# Patient Record
Sex: Male | Born: 1985 | Race: White | Hispanic: No | Marital: Single | Smoking: Never smoker
Health system: Southern US, Community
[De-identification: ages and names within clinical notes are randomized; demographics above are authoritative.]

## PROBLEM LIST (undated history)

## (undated) DIAGNOSIS — F988 Other specified behavioral and emotional disorders with onset usually occurring in childhood and adolescence: Secondary | ICD-10-CM

## (undated) DIAGNOSIS — M79673 Pain in unspecified foot: Secondary | ICD-10-CM

## (undated) DIAGNOSIS — F332 Major depressive disorder, recurrent severe without psychotic features: Secondary | ICD-10-CM

---

## 2015-02-03 ENCOUNTER — Ambulatory Visit
Admit: 2015-02-03 | Discharge: 2015-02-03 | Payer: PRIVATE HEALTH INSURANCE | Attending: Medical | Primary: Nurse Practitioner

## 2015-02-03 ENCOUNTER — Inpatient Hospital Stay: Admit: 2015-02-03 | Payer: Self-pay | Primary: Nurse Practitioner

## 2015-02-03 DIAGNOSIS — Z7689 Persons encountering health services in other specified circumstances: Secondary | ICD-10-CM

## 2015-02-03 DIAGNOSIS — Z139 Encounter for screening, unspecified: Secondary | ICD-10-CM

## 2015-02-03 LAB — METABOLIC PANEL, COMPREHENSIVE
A-G Ratio: 1.2 (ref 0.8–1.7)
ALT (SGPT): 20 U/L (ref 16–61)
AST (SGOT): 18 U/L (ref 15–37)
Albumin: 4.2 g/dL (ref 3.4–5.0)
Alk. phosphatase: 70 U/L (ref 45–117)
Anion gap: 4 mmol/L (ref 3.0–18)
BUN/Creatinine ratio: 15 (ref 12–20)
BUN: 15 MG/DL (ref 7.0–18)
Bilirubin, total: 0.4 MG/DL (ref 0.2–1.0)
CO2: 29 mmol/L (ref 21–32)
Calcium: 9.5 MG/DL (ref 8.5–10.1)
Chloride: 110 mmol/L — ABNORMAL HIGH (ref 100–108)
Creatinine: 1.02 MG/DL (ref 0.6–1.3)
GFR est AA: >60 mL/min/{1.73_m2} (ref >60)
GFR est non-AA: >60 mL/min/{1.73_m2} (ref >60)
Globulin: 3.4 g/dL (ref 2.0–4.0)
Glucose: 98 mg/dL (ref 74–99)
Potassium: 4.8 mmol/L (ref 3.5–5.5)
Protein, total: 7.6 g/dL (ref 6.4–8.2)
Sodium: 143 mmol/L (ref 136–145)

## 2015-02-03 MED ORDER — BUPROPION XL 150 MG 24 HR TAB
150 mg | ORAL_TABLET | ORAL | Status: DC
Start: 2015-02-03 — End: 2015-04-06

## 2015-02-03 MED ORDER — AMPHETAMINE-DEXTROAMPHETAMINE 5 MG TAB
5 mg | ORAL_TABLET | Freq: Every day | ORAL | Status: DC
Start: 2015-02-03 — End: 2015-03-02

## 2015-02-03 NOTE — Progress Notes (Signed)
Quick Note:        Please advise blood work looks good    ______

## 2015-02-03 NOTE — Progress Notes (Signed)
HISTORY OF PRESENT ILLNESS  Bobby DykesJoshua Reid is a 29 y.o. male.  HPI Comments: 29 yo male presenting to establish care and also to discuss depression and ADD.  Patient states that he has been struggling with depression for years but has never sought treatment. He states that he becomes tearful very easily, lacks interest in activities he use to enjoy and feels sad all the time.  He states its affecting his relationships.  He also states that he has difficulty concentrating and completing tasks. He states that he will start a task but it is difficult to pay attention long enough to finish it.  He recently lost his job and has lost several other jobs secondary to his inability to concentrate and frequent tardiness.        History reviewed. No pertinent past medical history.    No current outpatient prescriptions on file prior to visit.     No current facility-administered medications on file prior to visit.       Allergies   Allergen Reactions   ??? Penicillins Hives       Review of Systems   Constitutional: Positive for malaise/fatigue. Negative for fever and chills.   Eyes: Negative for blurred vision and double vision.   Respiratory: Negative for cough and shortness of breath.    Cardiovascular: Negative for chest pain and palpitations.   Gastrointestinal: Negative for heartburn, nausea, vomiting, abdominal pain, diarrhea, constipation, blood in stool and melena.   Musculoskeletal: Negative for myalgias.   Neurological: Positive for tingling (in hands in morning). Negative for headaches.   Psychiatric/Behavioral: Positive for depression. Negative for suicidal ideas. The patient does not have insomnia.           BP 110/72 mmHg   Pulse 82   Temp(Src) 97 ??F (36.1 ??C) (Oral)   Resp 16   Ht 5\' 11"  (1.803 m)   Wt 145 lb (65.772 kg)   BMI 20.23 kg/m2   SpO2 98%    Physical Exam   Constitutional: He is oriented to person, place, and time. He appears well-developed and well-nourished. No distress.   Tearful during history    HENT:   Right Ear: External ear normal.   Left Ear: External ear normal.   Nose: Nose normal.   Mouth/Throat: Oropharynx is clear and moist. No oropharyngeal exudate.   Eyes: Conjunctivae and EOM are normal. Pupils are equal, round, and reactive to light.   Neck: No thyromegaly present.   Cardiovascular: Normal rate and regular rhythm.    Pulmonary/Chest: Effort normal and breath sounds normal.   Abdominal: Soft. Bowel sounds are normal. He exhibits no distension and no mass. There is no tenderness. There is no rebound and no guarding.   Musculoskeletal: He exhibits no edema or tenderness.   Lymphadenopathy:     He has no cervical adenopathy.   Neurological: He is alert and oriented to person, place, and time.   Skin: Skin is warm and dry. He is not diaphoretic.   Psychiatric: His speech is delayed. He is slowed and withdrawn. He exhibits a depressed mood. He expresses no suicidal ideation.   S- over sleeps (+)  I- irritable (+)  G- guilt (+)  E- low energy (+)  C- concentration affected (+)  A- appetite disturbance (+)  P- psychomotor slowing (+)  S- denies suicidal ideations (-)       ASSESSMENT and PLAN    ICD-10-CM ICD-9-CM    1. Establishing care with new doctor, encounter for Z71.89 V65.8  CANCELED: CBC WITH AUTOMATED DIFF      CANCELED: HEMOGLOBIN A1C      CANCELED: T4, FREE      CANCELED: TSH, 3RD GENERATION      CANCELED: VITAMIN D, 25 HYDROXY   2. Severe episode of recurrent major depressive disorder, without psychotic features (HCC) F33.2 296.33 buPROPion XL (WELLBUTRIN XL) 150 mg tablet   3. ADD (attention deficit disorder) F90.0 314.00 dextroamphetamine-amphetamine (ADDERALL) 5 mg tablet   4. Tobacco abuse Z72.0 305.1 buPROPion XL (WELLBUTRIN XL) 150 mg tablet   5. Screening Z13.9 V82.9 METABOLIC PANEL, COMPREHENSIVE      CANCELED: CBC WITH AUTOMATED DIFF      CANCELED: LIPID PANEL      CANCELED: HEMOGLOBIN A1C      CANCELED: T4, FREE      CANCELED: TSH, 3RD GENERATION       CANCELED: VITAMIN D, 25 HYDROXY        Medical Decision Making:  Establish Care- labs- Advised I would follow up with patient regarding results.      Depression- Wellbutrin 150 mg XR + provided phone numbers for [place] [facility] and crisis hotlines with strong encouragement to call and schedule at [facility]- advised that exercise also helps elevate mood    ADD- Adderal 5 mg once daily- PMP pulled, nothing on report.  Advised of potential for abuse.      Follow-up Disposition:  Return in about 4 weeks (around 03/03/2015) for for follow up meds/depression.     Patient acknowledges understanding of instructions and acknowledges understanding to call back if current symptoms worsen or new symptoms arise.  Patient acknowledges and agrees with plan.    June Leap, PA

## 2015-02-03 NOTE — Progress Notes (Signed)
Bobby Reid is a 29 y.o. Bobby Reid presents today for depression. Pt is in Room # 8    1. Have you been to the ER, urgent care clinic since?  Hospitalized?No    2. Have you seen or consulted any other health care providers outside of the [facility] ?  Include any pap smears or colon screening. No

## 2015-02-03 NOTE — Patient Instructions (Addendum)
[phone] - emergency phone number for depression    [phone]  National Suicide Prevention Lifeline    [phone]- [place] [facility] intake phone number, call to schedule therapy appointment    [phone]- [place] health care exchange for low cost health insurance    Goodrx.com- research medications here to find cheapest cost at local pharmacies        Depression and Chronic Disease: Care Instructions  Your Care Instructions  A chronic disease is one that you have for a long time. Some chronic diseases can be controlled, but they usually cannot be cured. Depression is common in people with chronic diseases, but it often goes unnoticed.  Many people have concerns about seeking treatment for a mental health problem. You may think it's a sign of weakness, or you don't want people to know about it. It's important to overcome these reasons for not seeking treatment. Treating depression or anxiety is good for your health.  Follow-up care is a key part of your treatment and safety. Be sure to make and go to all appointments, and call your doctor if you are having problems. It's also a good idea to know your test results and keep a list of the medicines you take.  How can you care for yourself at home?  Watch for symptoms of depression  The symptoms of depression are often subtle at first. You may think they are caused by your disease rather than depression. Or you may think it is normal to be depressed when you have a chronic disease.  If you are depressed you may:  ?? Feel sad or hopeless.  ?? Feel guilty or worthless.  ?? Not enjoy the things you used to enjoy.  ?? Feel hopeless, as though life is not worth living.  ?? Have trouble thinking or remembering.  ?? Have low energy, and you may not eat or sleep well.  ?? Pull away from others.  ?? Think often about death or killing yourself. (Keep the numbers for these national suicide hotlines: 1-800-273-TALK [1-6502301995] and  1-800-SUICIDE [1-435-313-1395].)  Get treatment  By treating your depression, you can feel more hopeful and have more energy. If you feel better, you may take better care of yourself, so your health may improve.  ?? Talk to your doctor if you have any changes in mood during treatment for your disease.  ?? Ask your doctor for help. Counseling, antidepressant medicine, or a combination of the two can help most people with depression. Often a combination works best. Counseling can also help you cope with having a chronic disease.  When should you call for help?  Call 911 anytime you think you may need emergency care. For example, call if:  ?? You feel like hurting yourself or someone else.  ?? Someone you know has depression and is about to attempt or is attempting suicide.  Call your doctor now or seek immediate medical care if:  ?? You hear voices.  ?? Someone you know has depression and:  ?? Starts to give away his or her possessions.  ?? Uses illegal drugs or drinks alcohol heavily.  ?? Talks or writes about death, including writing suicide notes or talking about guns, knives, or pills.  ?? Starts to spend a lot of time alone.  ?? Acts very aggressively or suddenly appears calm.  Watch closely for changes in your health, and be sure to contact your doctor if:  ?? You do not get better as expected.  Where can you learn more?   Go to [email or web address][email or web address]://www.healthwise.net/[facility]  Enter A548 in the search box to learn more about "Depression and Chronic Disease: Care Instructions."   ?? 2006-2015 Healthwise[employer] Inc[employer]. Care instructions adapted under license by [employer] (which disclaims liability or warranty for this information). This care instruction is for use with your licensed healthcare professional. If you have questions about a medical condition or this instruction, always ask your healthcare professional. Healthwise, Incorporated disclaims any warranty or liability for your use of this information.   Content Version: 10.7.482551; Current as of: March 05, 2014

## 2015-02-04 NOTE — Telephone Encounter (Signed)
Attempted to contact patient Bobby DykesJoshua Reid regarding lab finding.  Call back number left and will call again.

## 2015-03-02 ENCOUNTER — Ambulatory Visit: Admit: 2015-03-02 | Discharge: 2015-03-02 | Attending: Medical | Primary: Nurse Practitioner

## 2015-03-02 DIAGNOSIS — F332 Major depressive disorder, recurrent severe without psychotic features: Secondary | ICD-10-CM

## 2015-03-02 MED ORDER — AMPHETAMINE-DEXTROAMPHETAMINE 10 MG TAB
10 mg | ORAL_TABLET | ORAL | Status: DC
Start: 2015-03-02 — End: 2015-03-26
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
## 2015-03-02 NOTE — Progress Notes (Signed)
[name] is a 29 y.o. male presents today for follow-up.     1. Have you been to the ER, urgent care clinic since your last visit?  Hospitalized since your last visit?No    2. Have you seen or consulted any other health care providers outside of the [facility] since your last visit?  Include any pap smears or colon screening. No

## 2015-03-02 NOTE — Patient Instructions (Signed)
Amphetamine/Dextroamphetamine (By mouth)   Treats ADHD. Also treats narcolepsy. This medicine is a stimulant.  Brand Name(s):Adderall, Adderall XR   There may be other brand names for this medicine.  When This Medicine Should Not Be Used:   This medicine is not right for everyone. Do not use it if you had an allergic reaction to amphetamine, dextroamphetamine, or similar medicines, or you have glaucoma, a history of drug abuse, heart or blood vessel disease (such as arteriosclerosis), or an overactive thyroid.   How to Use This Medicine:   Long Acting Capsule, Tablet  ?? Take your medicine as directed. Your dose may need to be changed several times to find what works best for you.  ?? Extended-release capsule:   ?? Take the capsule in the morning. You may have trouble falling asleep at night if you take it in the afternoon or evening.  ?? Swallow the capsule whole. Do not crush, break, or chew it.  ?? You may take the capsule with or without food.  ?? If you cannot swallow the capsule, carefully open it and sprinkle the beads over a spoonful of applesauce. Swallow the mixture right away without chewing. Do not store it. Do not crush or chew the beads.  ?? Tablet: Take it in the morning and early afternoon. You may have trouble falling asleep if you take it at night.  ?? This medicine should come with a Medication Guide. Ask your pharmacist for a copy if you do not have one.  ?? Missed dose: Take a dose as soon as you remember. If it is almost time for your next dose, wait until then and take a regular dose. Do not take extra medicine to make up for a missed dose.  ?? Store the medicine in a closed container at room temperature, away from heat, moisture, and direct light.  Drugs and Foods to Avoid:   Ask your doctor or pharmacist before using any other medicine, including over-the-counter medicines, vitamins, and herbal products.  ?? Do not use this medicine if you are taking an MAO inhibitor (MAOI) or  you took an MAOI within the past 14 days.  ?? Some medicines and foods can affect how this medicine works. Tell your doctor if you are taking any of the following:   ?? Blood pressure medicine  ?? Chlorpromazine  ?? Cold or allergy medicine that contains a decongestant  ?? Lithium  ?? Methenamine  ?? Seizure medicine  ?? Fruit juice, vitamin C, and some medicines can change how this medicine dissolves in your stomach. Tell your doctor if you use an antacid or a stomach medicine (such as lansoprazole, omeprazole, pantoprazole).  Warnings While Using This Medicine:   ?? Tell your doctor if you are pregnant or breastfeeding, or if you have heart rhythm problems, high blood pressure, circulation problems, or a history of heart attack or stroke. Tell your doctor if you or anyone in your family has a history of depression, bipolar disorder, suicide, mental illness, or drug or alcohol dependence. Tell your doctor if you have Tourette syndrome or a history of seizures.  ?? This medicine can be habit-forming. Do not use more than your prescribed dose. Call your doctor if you think your medicine is not working.  ?? This medicine may cause the following problems:   ?? Changes in behavior, or unusual thoughts  ?? Raynaud phenomenon (a problem with blood circulation in your fingers or toes)  ?? Serious heart or blood vessel problems  ??   Slowed growth (in children)  ?? This medicine may make you dizzy or cause blurred vision. Do not drive or do anything that could be dangerous until you know how this medicine affects you.  ?? Tell any doctor or dentist who treats you that you are using this medicine. This medicine may affect certain medical test results.  ?? Keep all medicine out of the reach of children. Never share your medicine with anyone.  Possible Side Effects While Using This Medicine:   Call your doctor right away if you notice any of these side effects:  ?? Allergic reaction: Itching or hives, swelling in your face or hands,  swelling or tingling in your mouth or throat, chest tightness, trouble breathing  ?? Blurred vision or changes in vision  ?? Chest pain, trouble breathing, or fainting  ?? Extreme energy or restlessness, mood or mental changes, confusion, agitation, unusual behavior  ?? Fever, shaking, or uncontrolled muscle movements  ?? Fast, pounding, or uneven heartbeat  ?? Seeing, hearing, or feeling things that are not there  ?? Seizure  If you notice these less serious side effects, talk with your doctor:   ?? Dry mouth, diarrhea, nausea, vomiting, stomach pain  ?? Headache or dizziness  ?? Loss of appetite, weight loss  ?? Trouble sleeping  If you notice other side effects that you think are caused by this medicine, tell your doctor.   Call your doctor for medical advice about side effects. You may report side effects to FDA at 1-800-FDA-1088  ?? 2014 [employer] Information is for End User's use only and may not be sold, redistributed or otherwise used for commercial purposes.  The above information is an educational aid only. It is not intended as medical advice for individual conditions or treatments. Talk to your doctor, nurse or pharmacist before following any medical regimen to see if it is safe and effective for you.

## 2015-03-16 NOTE — Telephone Encounter (Signed)
Spoke with Bobby Reid, Verified 2 patient identifiers. Spoke with patient in regards to lab results. Relayed PA's notes.  Patient acknowledges understanding and voices no further questions or concerns at this time.

## 2015-03-25 ENCOUNTER — Ambulatory Visit: Admit: 2015-03-25 | Discharge: 2015-03-25 | Attending: Family Medicine | Primary: Nurse Practitioner

## 2015-03-25 DIAGNOSIS — S99922A Unspecified injury of left foot, initial encounter: Secondary | ICD-10-CM

## 2015-03-25 MED ORDER — NAPROXEN 500 MG TAB
500 mg | ORAL_TABLET | Freq: Two times a day (BID) | ORAL | Status: DC
Start: 2015-03-25 — End: 2015-03-25

## 2015-03-25 MED ORDER — NAPROXEN 500 MG TAB
500 mg | ORAL_TABLET | Freq: Two times a day (BID) | ORAL | Status: DC
Start: 2015-03-25 — End: 2015-04-06

## 2015-03-25 NOTE — Progress Notes (Signed)
SUBJECTIVE:  Bobby Reid is a 29 y.o. year old male   Chief Complaint   Patient presents with   ??? Foot Injury       History of Present Illness:   The patient walked in after hour.    The patient is having moderate pain in the left heel for the last 2 weeks. The symptom started after he jumped off 5 feet and his heel landed on gravel. It seemed to get better with rest for few days; but the symptom got worse with swelling from standing on his feet all day at work.  The symptom is continuous with worsening from load bearing.  The symptom is not associated with weakness    No systemic, respiratory or cardiovascular symptoms.    Past Medical History   Diagnosis Date   ??? Depression    ??? ADD (attention deficit disorder)      History reviewed. No pertinent past surgical history.     Current Outpatient Prescriptions   Medication Sig   ??? dextroamphetamine-amphetamine (ADDERALL) 10 mg tablet Take half tablet by mouth twice daily   ??? buPROPion XL (WELLBUTRIN XL) 150 mg tablet Take 1 Tab by mouth every morning.     No current facility-administered medications for this visit.       Allergies   Allergen Reactions   ??? Penicillins Hives          Review of Systems:   ROS: Constitutional: No fever, chills, night sweats, malaise, dizziness.  Cardiovascular: No angina, palpitations, PND, orthopnea, lightheadedness, edema, claudication.  Respiratory: No dyspnea, wheeze, pleurisy, hemoptysis, unusual cough or sputum.  Gastrointestinal: No nausea/ vomiting, bowel habit change, pain, GER symptoms, melena, hematochezia, anorexia.  Neurological: No seizures, numbness, dizziness, speech abnormality, incontinence.  Musculoskeletal: No other joint swelling/pain, instability, focal weakness, stiffness/rigidity, radicular pain.  Psychiatric: No agitation, confusion/disorientation, suicidal or homicidal ideation.    OBJECTIVE:  Physical Exam:   Constitutional: General Appearance:  well developed, well nourished,  nontoxic, in no acute distress.   Visit Vitals   Item Reading   ??? BP 120/76 mmHg   ??? Pulse 94   ??? Temp(Src) 98.8 ??F (37.1 ??C) (Oral)   ??? Resp 16   ??? Ht 5\' 11"  (1.803 m)   ??? Wt 148 lb (67.132 kg)   ??? BMI 20.65 kg/m2   ??? SpO2 98%     Pulmonary: Respiratory effort: normal; no dyspnea, no retractions, no accessory muscle use.  GI:: Normal bowel sounds.  No masses; no tenderness; no rebound/rigidity; no CVA tenderness.  No hepatosplenomegaly.  Psychiatric: Oriented to time, place and person.  Musculoskeletal: NC/AT.  Neck-supple. Lt foot:- No heat, effusion, redness mild-moderate swelling @ the heel with moderate tenderness.  Good ROM with pain in heel.  No instability.  Strength OK.  Lt ankle: No heat, effusion, redness, tenderness.  Normal ROM.  No instability.  Normal strength & tone.        ASSESSMENT:     1. Injury of heel, left, initial encounter    2. Left foot pain        PLAN:     Pharmacologic Management: Medications reviewed with the patient.  Napoxen 500 mg bid with meals.     Orders Placed This Encounter   ??? XR CALCANEUS LT   ??? REFERRAL TO ORTHOPEDICS   ??? naproxen (NAPROSYN) 500 mg tablet      Other Instructions: Discussed DDx, follow-up & work-up.  Discussed risk/benefit & side effect of treatment.  Follow up  in 2 weeks, prn sooner.  Rest, warm compress and posture care to avoid pressure on the heel until seen by orthopedist.  Health risk from non adherence discussed.  Patient voiced understanding.     Joneen BoersSAGHANA Alanii Ramer, MD

## 2015-03-25 NOTE — Progress Notes (Signed)
Bobby DykesJoshua Reid is a 29 y.o. male presents today for same day sick visit for left heel pain. Pt is in Room # 5      Learning Assessment (baseline): Completed  Depression Screening: Completed      1. Have you been to the ER, urgent care clinic since your last visit? Hospitalized since your last visit?No    2. Have you seen or consulted any other health care providers outside of the [facility] since your last visit? Include any pap smears or colon screening. No

## 2015-03-26 ENCOUNTER — Encounter

## 2015-03-26 MED ORDER — AMPHETAMINE-DEXTROAMPHETAMINE 10 MG TAB
10 mg | ORAL_TABLET | ORAL | Status: DC
Start: 2015-03-26 — End: 2015-04-06

## 2015-03-26 NOTE — Telephone Encounter (Signed)
Attempted to contact patient Bobby DykesJoshua Fairbank to inform patient that prescription for Adderal is ready for pick up in from office. Phone number on file not in service please have patient give updated phone number.I myself or one of the other nurses will attempt to contact again.

## 2015-03-29 NOTE — Progress Notes (Signed)
Patient picked up prescription. Photo ID verified.

## 2015-03-30 ENCOUNTER — Emergency Department: Admit: 2015-03-31 | Payer: Self-pay | Primary: Nurse Practitioner

## 2015-03-30 ENCOUNTER — Encounter

## 2015-03-30 ENCOUNTER — Encounter: Attending: Medical | Primary: Nurse Practitioner

## 2015-03-30 DIAGNOSIS — S0990XA Unspecified injury of head, initial encounter: Secondary | ICD-10-CM

## 2015-03-30 NOTE — Telephone Encounter (Signed)
Called pt and unable to leave message. The call was to inform pt xrays needs to be complete today if not seen by orthopedic.

## 2015-03-30 NOTE — ED Notes (Signed)
Pt verbalizes that he feels better.  Pt discharged home-calling for a ride now.
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
## 2015-03-30 NOTE — ED Notes (Signed)
Pt yelling and screaming at some one on the phone.  Pt gets up and slams his room door and begins to yell even louder at people on the phone. Pt was told that he needed to lower his voice and that he was up for discharge and if he felt it was necessary he could resume his phone call outside.

## 2015-03-31 ENCOUNTER — Inpatient Hospital Stay
Admit: 2015-03-31 | Discharge: 2015-03-31 | Disposition: A | Discharge status: Home / Self Care | Payer: Self-pay | Attending: Emergency Medicine

## 2015-03-31 ENCOUNTER — Ambulatory Visit: Payer: Self-pay | Primary: Nurse Practitioner

## 2015-03-31 MED ORDER — HYDROMORPHONE 2 MG/ML INJECTION SOLUTION
2 mg/mL | INTRAMUSCULAR | Status: AC
Start: 2015-03-31 — End: 2015-03-30
  Administered 2015-03-31: 02:00:00 via INTRAVENOUS

## 2015-03-31 MED ORDER — DIPHTH,PERTUS(AC)TETANUS VAC(PF) 2.5 LF UNIT-8 MCG-5 LF/0.5 ML INJ
INTRAMUSCULAR | Status: AC
Start: 2015-03-31 — End: 2015-03-30
  Administered 2015-03-31: 02:00:00 via INTRAMUSCULAR

## 2015-03-31 MED ORDER — IBUPROFEN 800 MG TAB
800 mg | ORAL_TABLET | Freq: Four times a day (QID) | ORAL | Status: AC | PRN
Start: 2015-03-31 — End: 2015-04-06

## 2015-03-31 MED ORDER — HYDROCODONE-ACETAMINOPHEN 5 MG-325 MG TAB
5-325 mg | ORAL_TABLET | ORAL | Status: DC
Start: 2015-03-31 — End: 2015-04-06

## 2015-03-31 MED ORDER — HYDROMORPHONE 2 MG/ML INJECTION SOLUTION
2 mg/mL | INTRAMUSCULAR | Status: AC
Start: 2015-03-31 — End: 2015-03-30

## 2015-03-31 MED ORDER — CYCLOBENZAPRINE 10 MG TAB
10 mg | ORAL_TABLET | Freq: Three times a day (TID) | ORAL | Status: AC
Start: 2015-03-31 — End: 2015-04-02

## 2015-03-31 MED FILL — BOOSTRIX TDAP 2.5 LF UNIT-8 MCG-5 LF/0.5 ML INTRAMUSCULAR SYRINGE: INTRAMUSCULAR | Qty: 1

## 2015-03-31 MED FILL — HYDROMORPHONE 2 MG/ML INJECTION SOLUTION: 2 mg/mL | INTRAMUSCULAR | Qty: 1

## 2015-04-06 ENCOUNTER — Ambulatory Visit: Admit: 2015-04-06 | Discharge: 2015-04-06 | Attending: Medical | Primary: Nurse Practitioner

## 2015-04-06 ENCOUNTER — Telehealth

## 2015-04-06 DIAGNOSIS — F419 Anxiety disorder, unspecified: Secondary | ICD-10-CM

## 2015-04-06 MED ORDER — BUPROPION XL 300 MG 24 HR TAB
300 mg | ORAL_TABLET | ORAL | Status: DC
Start: 2015-04-06 — End: 2015-10-13

## 2015-04-06 MED ORDER — AMPHETAMINE-DEXTROAMPHETAMINE 10 MG TAB
10 mg | ORAL_TABLET | Freq: Two times a day (BID) | ORAL | Status: DC
Start: 2015-04-06 — End: 2015-04-06

## 2015-04-06 MED ORDER — AMPHETAMINE-DEXTROAMPHETAMINE 20 MG TAB
20 mg | ORAL_TABLET | ORAL | Status: DC
Start: 2015-04-06 — End: 2015-04-27

## 2015-04-06 NOTE — Telephone Encounter (Signed)
Patient had requested a larger dose to split as 10 mg BID was too expensive
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
## 2015-04-06 NOTE — Patient Instructions (Signed)
Attention Deficit Hyperactivity Disorder (ADHD) in Adults: Care Instructions  Your Care Instructions  Attention deficit hyperactivity disorder, or ADHD, is a condition that makes it hard to pay attention. So you may have problems when you try to focus, get organized, and finish tasks. It might make you more active than other people. Or you might do things without thinking first.  ADHD is very common. It usually starts in early childhood. Many adults don't realize they have it until their children are diagnosed. Then they become aware of their own symptoms.  Doctors don't know what causes ADHD. But it often runs in families.  ADHD can be treated with medicines, behavior training, and counseling. Treatment can improve your life.  Follow-up care is a key part of your treatment and safety. Be sure to make and go to all appointments, and call your doctor if you are having problems. It's also a good idea to know your test results and keep a list of the medicines you take.  How can you care for yourself at home?  ?? Learn all you can about ADHD. This will help you and your family understand it better.  ?? Take your medicines exactly as prescribed. Call your doctor if you think you are having a problem with your medicine. You will get more details on the specific medicines your doctor prescribes.  ?? If you miss a dose of your medicine, do not take an extra dose.  ?? If your doctor suggests counseling, find a counselor you like and trust. Talk openly and honestly. Be willing to make some changes.  ?? Find a support group for adults with ADHD. Talking to others with the same problems can help you feel better. It can also give you ideas about how to best cope with the condition.  ?? Get rid of distractions at your work space. Keep your desk clean. Try not to face a window or busy hallway.  ?? Use files, planners, and other tools to keep you organized.  ?? Limit use of alcohol, and do not use illegal drugs. People with ADHD  tend to become addicted more easily than others. Tell your doctor if you need help to quit. Counseling, support groups, and sometimes medicines can help you stay free of alcohol or drugs.  ?? Get at least 30 minutes of physical activity on most days of the week. Exercise has been shown to help people cope with ADHD. Walking is a good choice. You also may want to do other activities, such as running, swimming, cycling, or playing tennis or team sports.  When should you call for help?  Watch closely for changes in your health, and be sure to contact your doctor if:  ?? You feel sad a lot or cry all the time.  ?? You have trouble sleeping, or you sleep too much.  ?? You find it hard to concentrate, make decisions, or remember things.  ?? You change how you normally eat.  ?? You feel guilty for no reason.  Where can you learn more?  Go to http://www.healthwise.net/GoodHelpConnections  Enter B196 in the search box to learn more about "Attention Deficit Hyperactivity Disorder (ADHD) in Adults: Care Instructions."  ?? 2006-2016 [employer]. Care instructions adapted under license by [employer] (which disclaims liability or warranty for this information). This care instruction is for use with your licensed healthcare professional. If you have questions about a medical condition or this instruction, always ask your healthcare professional. Healthwise, Incorporated disclaims any warranty   or liability for your use of this information.  Content Version: 10.9.538570; Current as of: October 02, 2014
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
## 2015-04-19 ENCOUNTER — Encounter: Attending: Family Medicine | Primary: Nurse Practitioner

## 2015-04-27 ENCOUNTER — Encounter

## 2015-04-27 MED ORDER — AMPHETAMINE-DEXTROAMPHETAMINE 20 MG TAB
20 mg | ORAL_TABLET | ORAL | Status: DC
Start: 2015-04-27 — End: 2015-05-27

## 2015-05-25 ENCOUNTER — Encounter: Attending: Medical | Primary: Nurse Practitioner

## 2015-05-27 ENCOUNTER — Ambulatory Visit: Admit: 2015-05-27 | Attending: Medical | Primary: Nurse Practitioner

## 2015-05-27 DIAGNOSIS — F988 Other specified behavioral and emotional disorders with onset usually occurring in childhood and adolescence: Secondary | ICD-10-CM

## 2015-05-27 MED ORDER — AMPHETAMINE-DEXTROAMPHETAMINE 20 MG TAB
20 mg | ORAL_TABLET | Freq: Two times a day (BID) | ORAL | Status: DC
Start: 2015-05-27 — End: 2015-06-21

## 2015-05-27 NOTE — Progress Notes (Signed)
History and Physical    Patient: Bobby Reid MRN: [id number]  SSN: [id number]    Date of Birth: [birth year]  Age: 29 y.o.  Sex: male      Subjective:      Bobby Reid is a 29 y.o. male who presents today as he states that he received a phone call from our system saying that he had to come in for a physical.  He states that the adderall 20 mg that he has been taking twice daily was working until about 1 week ago, where he has been having severe fatigue.  He states he sleeps more than 8 hours a night.  States the medication will give him energy for 1 hour then feels tired.  No new medications, no new routines etc.      He states Wellbutrin continues to work well for him, he denies SI/HI.          PMH:  Past Medical History   Diagnosis Date   ??? Depression    ??? ADD (attention deficit disorder)      History reviewed. No pertinent past surgical history.     FamHx:  Family History   Problem Relation Age of Onset   ??? Alcohol abuse Mother    ??? Anxiety Father        Socialhx:  History   Substance Use Topics   ??? Smoking status: Current Every Day Smoker -- 0.25 packs/day for 15 years   ??? Smokeless tobacco: Never Used   ??? Alcohol Use: No      Comment: social         Meds:  Prior to Admission medications    Medication Sig Start Date End Date Taking? Authorizing Provider   naproxen sodium (NAPROSYN) 220 mg tablet Take 220 mg by mouth two (2) times daily (with meals).   Yes Historical Provider   dextroamphetamine-amphetamine (ADDERALL) 20 mg tablet Take 1 Tab by mouth two (2) times a day. Max Daily Amount: 40 mg. 05/27/15  Yes Royetta CrochetJessica Nakai Yard V, PA   buPROPion XL (WELLBUTRIN XL) 300 mg XL tablet Take 1 Tab by mouth every morning. 04/06/15  Yes Royetta CrochetJessica Beckhem Isadore V, PA        Allergies:  Allergies   Allergen Reactions   ??? Penicillins Hives       Review of Systems:  Items in bold are positive:  Constitutional: fatigue, negative for fevers, chills and malaise  Eyes: negative for visual disturbance   Ears, Nose, Mouth, Throat, and Face: negative for nasal congestion  Respiratory: negative for cough or SOB  Cardiovascular: negative for chest pain, chest pressure/discomfort  Gastrointestinal: negative for nausea, vomiting, melena, BRBPR, diarrhea, constipation and abdominal pain  Genitourinary:negative for frequency, dysuria, hesitancy and decreased stream  Musculoskeletal:negative for myalgias and arthralgias  Neurological: negative for headaches, dizziness and paresthesia    Objective:     Filed Vitals:    05/27/15 1650   BP: 132/80   Pulse: 96   Temp: 98 ??F (36.7 ??C)   TempSrc: Oral   Resp: 16   Height: 5\' 11"  (1.803 m)   Weight: 152 lb 6.4 oz (69.128 kg)   SpO2: 97%        Physical Exam:  GENERAL: alert, cooperative, no distress, appears stated age  HEENT: EYE: conjunctivae/corneas clear. PERRL, EOM's intact.   LUNG: clear to auscultation bilaterally  HEART: regular rate and rhythm, S1, S2 normal, no murmur, click, rub or gallop  NEUROLOGIC: AOx3. Gait normal.  PSYCH: mood- euthymic  Assessment and Plan:       ICD-10-CM ICD-9-CM    1. ADD (attention deficit disorder) F90.0 314.00 dextroamphetamine-amphetamine (ADDERALL) 20 mg tablet   2. Severe episode of recurrent major depressive disorder, without psychotic features (HCC) F33.2 296.33          Medical Decision Making:  ADD- increasing Adderall to 20 mg BID- increasing due to extreme sleepiness- advised may try period of 30 mg if 40 mg daily dose too much or too expensive- will continue to monitor    Depression- continue current therapy- well controlled        Follow-up Disposition:  Return in about 6 months (around 11/27/2015).      Patient acknowledges understanding of instructions and acknowledges understanding to call back if current symptoms worsen or new symptoms arise.  Patient acknowledges and agrees with plan.    Signed By: June Leap, PA     May 27, 2015

## 2015-05-27 NOTE — Patient Instructions (Signed)
Learning About Attention Deficit Hyperactivity Disorder (ADHD) in Adults  What is ADHD?  Attention deficit hyperactivity disorder (ADHD) is a condition in which people have a hard time paying attention. Adults with ADHD also may be more active than normal. They tend to act without thinking. ADHD may make it harder for them to focus, get organized, and finish tasks.  ADHD most often starts in childhood and lasts into adulthood. Many adults don't know that they have ADHD until their children are diagnosed. Then they begin to see their own symptoms.  Doctors don't know what causes ADHD. But it tends to run in families.  What are the symptoms?  The most common types of ADHD symptoms in adults are attention problems and hyperactivity.  Attention problems  Adults with ADHD often find it hard to:  ?? Finish tasks that don't interest them or aren't easy. But they may become obsessed with activities that they find interesting and enjoy.  ?? Keep relationships.  ?? Focus their attention on conversations, reading materials, or jobs. They may change jobs a lot.  ?? Remember things. They may misplace or lose things.  ?? Pay attention. They are easily distracted. They find it hard to focus on one task.  ?? Think before they act. They may make quick decisions. They may act before they think about the effect of their actions.  Hyperactivity  Adults with ADHD may:  ?? Fidget. They may swing their legs, shift in their seats, or tap their fingers.  ?? Move around a lot. They may feel "revved up" or on the go. They may not be able to slow down until they are very tired.  ?? Find it hard to relax. They may feel restless and find it hard to do quiet things like read or watch TV.  How does ADHD affect daily life?  ADHD in adults may affect:  ?? Job performance. They may find it hard to organize their work, manage their time, and focus on one task at a time. They may forget, misplace, or lose things. They may quit their jobs out of boredom.   ?? Relationships. Adults with ADHD may find it hard to focus their attention on conversations. It is hard for them to "read" the behavior and moods of others and express their own feelings.  ?? Temper. They may get easily frustrated. This often can make it harder for them to deal with stress. These adults may overreact and have a short, quick temper.  ?? The ability to solve problems. Adults who have a hard time waiting for things they want may act before they think about the effect of their actions. They may take part in risky behaviors. These include unprotected sex, unsafe driving, alcohol and drug use, or unwise business ventures.  How is ADHD treated?  ADHD can be treated with medicines, behavior training, or counseling. Or it may be a combination of these treatments.  Medicines  Stimulant medicines are most often used to treat ADHD. These may include:  ?? Amphetamines (such as Adderall and Dexedrine).  ?? Methylphenidate (such as Concerta, Daytrana, Focalin, Metadate, and Ritalin).  Other medicines that may be used are:  ?? Atomoxetine, such as Strattera, a nonstimulant medicine for ADHD.  ?? Antihypertensives. These include clonidine (such as Catapres) and guanfacine (such as Tenex).  ?? Antidepressants, which include bupropion (Wellbutrin).  Behavior training  Behavior training can help adults with ADHD learn how to:  ?? Get organized. A daily organizer or planner can help   these adults organize their daily tasks. They can write down appointments and other things they need to remember.  ?? Decrease distractions. They can set up their work or home environment so that there are fewer things that will distract them. They may find using headphones or a "white noise" machine helpful. College students can arrange a quiet living situation. They may need a single dorm room.  ?? Work on relationships. Social skills training can help adults with ADHD relate to family, friends, and coworkers. Couples counseling or family  therapy can also help improve relationships.  Counseling  Counseling is not meant to treat inattention, hyperactivity, or impulsiveness. But it can help with some of the problems that go along with ADHD. These include not getting along well with others and having problems following rules.  Where can you learn more?  Go to http://www.healthwise.net/GoodHelpConnections  Enter Z848 in the search box to learn more about "Learning About Attention Deficit Hyperactivity Disorder (ADHD) in Adults."  ?? 2006-2016 Healthwise, Incorporated. Care instructions adapted under license by [employer] (which disclaims liability or warranty for this information). This care instruction is for use with your licensed healthcare professional. If you have questions about a medical condition or this instruction, always ask your healthcare professional. Healthwise, Incorporated disclaims any warranty or liability for your use of this information.  Content Version: 10.9.538570; Current as of: October 02, 2014

## 2015-05-27 NOTE — Progress Notes (Signed)
Paul DykesJoshua Reuss is a 29 y.o. male presents today for complete physical exam.          Pt is in Room # 8      1. Have you been to the ER, urgent care clinic since your last visit?  Hospitalized since your last visit?No    2. Have you seen or consulted any other health care providers outside of the [facility] since your last visit?  Include any pap smears or colon screening. No

## 2015-06-21 ENCOUNTER — Encounter

## 2015-06-24 MED ORDER — AMPHETAMINE-DEXTROAMPHETAMINE 20 MG TAB
20 mg | ORAL_TABLET | Freq: Two times a day (BID) | ORAL | 0 refills | Status: DC
Start: 2015-06-24 — End: 2015-07-20

## 2015-07-20 ENCOUNTER — Encounter

## 2015-07-21 MED ORDER — AMPHETAMINE-DEXTROAMPHETAMINE 20 MG TAB
20 mg | ORAL_TABLET | Freq: Two times a day (BID) | ORAL | 0 refills | Status: DC
Start: 2015-07-21 — End: 2015-08-17

## 2015-07-21 NOTE — Telephone Encounter (Signed)
I called [phone] and [phone] and was unable to schedule an appointment with Dr. Pernell Dupre for Lymphadenopathy. Will try again tomorrow to schedule appointment...JDG

## 2015-07-26 NOTE — Telephone Encounter (Signed)
I called [phone] at 5:01pm on 09/12/216 and was unable to schedule an appointment with Dr. Pernell Dupre for  Lymphadenopathy. This is the second time I have tried to schedule Bobby Reid's appointment...JDG

## 2015-08-17 ENCOUNTER — Ambulatory Visit
Admit: 2015-08-17 | Discharge: 2015-08-17 | Payer: PRIVATE HEALTH INSURANCE | Attending: Medical | Primary: Nurse Practitioner

## 2015-08-17 DIAGNOSIS — F988 Other specified behavioral and emotional disorders with onset usually occurring in childhood and adolescence: Secondary | ICD-10-CM

## 2015-08-17 MED ORDER — AMPHETAMINE-DEXTROAMPHETAMINE 30 MG TAB
30 mg | ORAL_TABLET | Freq: Two times a day (BID) | ORAL | 0 refills | Status: DC
Start: 2015-08-17 — End: 2015-09-15

## 2015-08-17 NOTE — Progress Notes (Signed)
Maxine Huynh is a 29 y.o. male presents today for medication management. Patient reports that he has a second job and his adderall medication is not effective by the start of the next job. Patient also reports rash on his back.          Pt is in Room # 8        1. Have you been to the ER, urgent care clinic since your last visit?  Hospitalized since your last visit?No    2. Have you seen or consulted any other health care providers outside of the [facility] since your last visit?  Include any pap smears or colon screening. No       HM reviewed: patient agrees to update his flu shot.        Carolos Fecher is a 29 y.o. male who presents for routine immunizations.   He denies any symptoms , reactions or allergies that would exclude them from being immunized today.  Risks and adverse reactions were discussed and the VIS was given to them. All questions were addressed.  He was observed for 10 min post injection. There were no reactions observed.    Carlus Pavlov, LPN

## 2015-08-17 NOTE — Addendum Note (Signed)
Addended by: Verdene Lennert on: 08/17/2015 04:08 PM      Modules accepted: Level of Service

## 2015-08-17 NOTE — Patient Instructions (Addendum)
Vaccine Information Statement    Influenza (Flu) Vaccine (Inactivated or Recombinant): What you need to know    Many Vaccine Information Statements are available in Spanish and other languages. See www.immunize.org/vis  Hojas de Informaci??n Sobre Vacunas est??n disponibles en Espa??ol y en muchos otros idiomas. Visite www.immunize.org/vis    1. Why get vaccinated?    Influenza (???flu???) is a contagious disease that spreads around the [place] every year, usually between October and May.     Flu is caused by influenza viruses, and is spread mainly by coughing, sneezing, and close contact.     Anyone can get flu. Flu strikes suddenly and can last several days. Symptoms vary by age, but can include:  ??? fever/chills  ??? sore throat  ??? muscle aches  ??? fatigue  ??? cough  ??? headache   ??? runny or stuffy nose    Flu can also lead to pneumonia and blood infections, and cause diarrhea and seizures in children.  If you have a medical condition, such as heart or lung disease, flu can make it worse.    Flu is more dangerous for some people. Infants and young children, people 65 years of age and older, pregnant women, and people with certain health conditions or a weakened immune system are at greatest risk.      Each year thousands of people in the [place] die from flu, and many more are hospitalized.     Flu vaccine can:  ??? keep you from getting flu,  ??? make flu less severe if you do get it, and  ??? keep you from spreading flu to your family and other people.     2. Inactivated and recombinant flu vaccines    A dose of flu vaccine is recommended every flu season. Children 6 months through 8 years of age may need two doses during the same flu season.  Everyone else needs only one dose each flu season.       Some inactivated flu vaccines contain a very small amount of a mercury-based preservative called thimerosal. Studies have not shown thimerosal in vaccines to be harmful, but flu vaccines that do not contain  thimerosal are available.    There is no live flu virus in flu shots.  They cannot cause the flu.     There are many flu viruses, and they are always changing. Each year a new flu vaccine is made to protect against three or four viruses that are likely to cause disease in the upcoming flu season. But even when the vaccine doesn???t exactly match these viruses, it may still provide some protection    Flu vaccine cannot prevent:  ??? flu that is caused by a virus not covered by the vaccine, or  ??? illnesses that look like flu but are not.    It takes about 2 weeks for protection to develop after vaccination, and protection lasts through the flu season.     3. Some people should not get this vaccine    Tell the person who is giving you the vaccine:    ??? If you have any severe, life-threatening allergies.    If you ever had a life-threatening allergic reaction after a dose of flu vaccine, or have a severe allergy to any part of this vaccine, you may be advised not to get vaccinated.  Most, but not all, types of flu vaccine contain a small amount of egg protein.       ??? If you   ever had Guillain-Barr?? Syndrome (also called GBS).   Some people with a history of GBS should not get this vaccine. This should be discussed with your doctor.    ??? If you are not feeling well.    It is usually okay to get flu vaccine when you have a mild illness, but you might be asked to come back when you feel better.      4. Risks of a vaccine reaction    With any medicine, including vaccines, there is a chance of reactions. These are usually mild and go away on their own, but serious reactions are also possible.     Most people who get a flu shot do not have any problems with it.     Minor problems following a flu shot include:   ??? soreness, redness, or swelling where the shot was given    ??? hoarseness  ??? sore, red or itchy eyes  ??? cough  ??? fever  ??? aches  ??? headache  ??? itching  ??? fatigue   If these problems occur, they usually begin soon after the shot and last 1 or 2 days.     More serious problems following a flu shot can include the following:    ??? There may be a small increased risk of Guillain-Barr?? Syndrome (GBS) after inactivated flu vaccine.  This risk has been estimated at 1 or 2 additional cases per million people vaccinated. This is much lower than the risk of severe complications from flu, which can be prevented by flu vaccine.      ??? Young children who get the flu shot along with pneumococcal vaccine (PCV13) and/or DTaP vaccine at the same time might be slightly more likely to have a seizure caused by fever. Ask your doctor for more information. Tell your doctor if a child who is getting flu vaccine has ever had a seizure.     Problems that could happen after any injected vaccine:     ??? People sometimes faint after a medical procedure, including vaccination. Sitting or lying down for about 15 minutes can help prevent fainting, and injuries caused by a fall. Tell your doctor if you feel dizzy, or have vision changes or ringing in the ears.    ??? Some people get severe pain in the shoulder and have difficulty moving the arm where a shot was given. This happens very rarely.    ??? Any medication can cause a severe allergic reaction. Such reactions from a vaccine are very rare, estimated at about 1 in a million doses, and would happen within a few minutes to a few hours after the vaccination.    As with any medicine, there is a very remote chance of a vaccine causing a serious injury or death.    The safety of vaccines is always being monitored. For more information, visit: www.cdc.gov/vaccinesafety/    5. What if there is a serious reaction?    What should I look for?    ??? Look for anything that concerns you, such as signs of a severe allergic reaction, very high fever, or unusual behavior.    Signs of a severe allergic reaction can include hives, swelling of the  face and throat, difficulty breathing, a fast heartbeat, dizziness, and weakness ??? usually within a few minutes to a few hours after the vaccination.    What should I do?    ??? If you think it is a severe allergic reaction or other emergency that   can???t wait, call 9-1-1 and get the person to the nearest hospital. Otherwise, call your doctor.    ??? Reactions should be reported to the Vaccine Adverse Event Reporting System (VAERS). Your doctor should file this report, or you can do it yourself through  the VAERS web site at www.vaers.LAgents.no, or by calling [phone].    VAERS does not give medical advice.    6. The National Vaccine Injury Compensation Program    The [employer] Vaccine Injury Compensation Program (VICP) is a federal program that was created to compensate people who may have been injured by certain vaccines.    Persons who believe they may have been injured by a vaccine can learn about the program and about filing a claim by calling 1-250-570-0422 or visiting the VICP website at SpiritualWord.at.  There is a time limit to file a claim for compensation.    7. How can I learn more?  ??? Ask your healthcare provider. He or she can give you the vaccine package insert or suggest other sources of information.  ??? Call your local or state health department.  ??? Contact the Centers for Disease Control and Prevention (CDC):  - Call (303) 572-2834 (1-800-CDC-INFO) or  - Visit CDC???s website at BiotechRoom.com.cy    Vaccine Information Statement   Inactivated Influenza Vaccine   06/19/2014  [place] ?? 308MV-[id number]    Department of Health and Insurance risk surveyor for Disease Control and Prevention    Office Use Only    Learning About Attention Deficit Hyperactivity Disorder (ADHD) in Adults  What is ADHD?  Attention deficit hyperactivity disorder (ADHD) is a condition in which people have a hard time paying attention. Adults with ADHD also may be  more active than normal. They tend to act without thinking. ADHD may make it harder for them to focus, get organized, and finish tasks.  ADHD most often starts in childhood and lasts into adulthood. Many adults don't know that they have ADHD until their children are diagnosed. Then they begin to see their own symptoms.  Doctors don't know what causes ADHD. But it tends to run in families.  What are the symptoms?  The most common types of ADHD symptoms in adults are attention problems and hyperactivity.  Attention problems  Adults with ADHD often find it hard to:  ?? Finish tasks that don't interest them or aren't easy. But they may become obsessed with activities that they find interesting and enjoy.  ?? Keep relationships.  ?? Focus their attention on conversations, reading materials, or jobs. They may change jobs a lot.  ?? Remember things. They may misplace or lose things.  ?? Pay attention. They are easily distracted. They find it hard to focus on one task.  ?? Think before they act. They may make quick decisions. They may act before they think about the effect of their actions.  Hyperactivity  Adults with ADHD may:  ?? Fidget. They may swing their legs, shift in their seats, or tap their fingers.  ?? Move around a lot. They may feel "revved up" or on the go. They may not be able to slow down until they are very tired.  ?? Find it hard to relax. They may feel restless and find it hard to do quiet things like read or watch TV.  How does ADHD affect daily life?  ADHD in adults may affect:  ?? Job performance. They may find it hard to organize their work, manage their time, and focus on one  task at a time. They may forget, misplace, or lose things. They may quit their jobs out of boredom.  ?? Relationships. Adults with ADHD may find it hard to focus their attention on conversations. It is hard for them to "read" the behavior and moods of others and express their own feelings.   ?? Temper. They may get easily frustrated. This often can make it harder for them to deal with stress. These adults may overreact and have a short, quick temper.  ?? The ability to solve problems. Adults who have a hard time waiting for things they want may act before they think about the effect of their actions. They may take part in risky behaviors. These include unprotected sex, unsafe driving, alcohol and drug use, or unwise business ventures.  How is ADHD treated?  ADHD can be treated with medicines, behavior training, or counseling. Or it may be a combination of these treatments.  Medicines  Stimulant medicines are most often used to treat ADHD. These may include:  ?? Amphetamines (such as Adderall and Dexedrine).  ?? Methylphenidate (such as Concerta, Daytrana, Focalin, Metadate, and Ritalin).  Other medicines that may be used are:  ?? Atomoxetine, such as Strattera, a nonstimulant medicine for ADHD.  ?? Antihypertensives. These include clonidine (such as Catapres) and guanfacine (such as Tenex).  ?? Antidepressants, which include bupropion (Wellbutrin).  Behavior training  Behavior training can help adults with ADHD learn how to:  ?? Get organized. A daily organizer or planner can help these adults organize their daily tasks. They can write down appointments and other things they need to remember.  ?? Decrease distractions. They can set up their work or home environment so that there are fewer things that will distract them. They may find using headphones or a "white noise" machine helpful. College students can arrange a quiet living situation. They may need a single dorm room.  ?? Work on relationships. Social skills training can help adults with ADHD relate to family, friends, and coworkers. Couples counseling or family therapy can also help improve relationships.  Counseling  Counseling is not meant to treat inattention, hyperactivity, or impulsiveness. But it can help with some of the problems that go along  with ADHD. These include not getting along well with others and having problems following rules.  Where can you learn more?  Go to InsuranceStats.ca  Enter [phone] in the search box to learn more about "Learning About Attention Deficit Hyperactivity Disorder (ADHD) in Adults."  ?? 2006-2016 Healthwise, Incorporated. Care instructions adapted under license by [employer] (which disclaims liability or warranty for this information). This care instruction is for use with your licensed healthcare professional. If you have questions about a medical condition or this instruction, always ask your healthcare professional. Healthwise, Incorporated disclaims any warranty or liability for your use of this information.  Content Version: 11.0.578772; Current as of: October 02, 2014

## 2015-08-17 NOTE — Progress Notes (Signed)
History and Physical    Patient: Bobby Reid MRN: [id number]  SSN: [id number]    Date of Birth: Jul 31, 1986  Age: 29 y.o.  Sex: male      Subjective:      Bobby Reid is a 29 y.o. male who is presenting for follow up of ADD and depression.  Patient reports that he is currently working 2 jobs and that he feels his Adderall is wearing off too quickly by the time he gets to his second job.  Takes first pill around 7 am and second pill around noon, but he does not start working at his second job until 7 pm.  He states that he feels that it works fairly well.    In terms of his depression, he continues to take Wellbutrin.  He states that he is feeling much better as his stressor, his ex girlfriend, is now gone.    Patient also has c/o rash on his back.  He denies itching of rash but just noticed bumps, he does admit to sweating a lot, particularly at his second job.  He use to bring a change of clothes to work but has not done that in a while.      PMH:  Past Medical History   Diagnosis Date   ??? ADD (attention deficit disorder)    ??? Depression      History reviewed. No pertinent past surgical history.     FamHx:  Family History   Problem Relation Age of Onset   ??? Alcohol abuse Mother    ??? Anxiety Father        Socialhx:  Social History   Substance Use Topics   ??? Smoking status: Current Every Day Smoker     Packs/day: 0.25     Years: 15.00   ??? Smokeless tobacco: Never Used   ??? Alcohol use No      Comment: social         Meds:  Prior to Admission medications    Medication Sig Start Date End Date Taking? Authorizing Provider   dextroamphetamine-amphetamine (ADDERALL) 30 mg tablet Take 1 Tab by mouth two (2) times a day. Max Daily Amount: 2 Tabs. 08/17/15  Yes Royetta Crochet V, PA   buPROPion XL (WELLBUTRIN XL) 300 mg XL tablet Take 1 Tab by mouth every morning. 04/06/15  Yes Royetta Crochet V, PA        Allergies:  Allergies   Allergen Reactions   ??? Penicillins Hives       Review of Systems:   Items in bold are positive:  Constitutional: negative for fevers, chills and malaise  Eyes: negative for visual disturbance  Ears, Nose, Mouth, Throat, and Face: negative for nasal congestion  Respiratory: negative for cough or SOB  Cardiovascular: negative for chest pain, chest pressure/discomfort  Gastrointestinal: negative for nausea, vomiting, melena, BRBPR, diarrhea, constipation and abdominal pain  Genitourinary:negative for frequency, dysuria, hesitancy and decreased stream  Musculoskeletal:negative for myalgias and arthralgias  Neurological: negative for headaches, dizziness and paresthesia  Psych: depression-stable, attention/concentration- somewhat  Controlled, denies si/hi  Skin: rash on back    Objective:     Vitals:    08/17/15 0830   BP: 108/63   Pulse: 83   Resp: 16   Temp: 97 ??F (36.1 ??C)   TempSrc: Oral   SpO2: 99%   Weight: 151 lb (68.5 kg)   Height:  (1.803 m)        Physical Exam:  GENERAL: alert, cooperative, no  distress, appears stated age  HEENT: EYE: conjunctivae/corneas clear. PERRL, EOM's intact.   LUNG: clear to auscultation bilaterally  HEART: regular rate and rhythm, S1, S2 normal, no murmur, click, rub or gallop  NEUROLOGIC: AOx3. Gait normal.  PSYCH: mood- happy; affect-normal  SKIN: several areas of hyperpigmentation and erythematous papules, in somewhat of a christmas tree pattern, no herald patch, no overlying scale        Assessment and Plan:       ICD-10-CM ICD-9-CM    1. ADD (attention deficit disorder) F98.8 314.00 dextroamphetamine-amphetamine (ADDERALL) 30 mg tablet   2. Severe episode of recurrent major depressive disorder, without psychotic features (HCC) F33.2 296.33    3. Folliculitis L73.9 704.8    4. Encounter for immunization Z23 V03.89 INFLUENZA VIRUS VAC QUAD,SPLIT,PRESV FREE SYRINGE 3/> YRS IM         Medical Decision Making:  ADD- increasing adderall to 30 mg BID- advised to try to stretch out the  time he takes second pill due to the delayed start time of his second job     Depression- continue current therapy- well controlled    Folliculitis- advised patient to try to stay cool and dry and change clothes when he is particularly damp    Follow-up Disposition:  Return in about 3 months (around 11/17/2015) for routine care with me.      Patient acknowledges understanding of instructions and acknowledges understanding to call back if current symptoms worsen or new symptoms arise.  Patient acknowledges and agrees with plan.    Signed By: June Leap, PA     August 17, 2015

## 2015-09-10 ENCOUNTER — Encounter

## 2015-09-13 NOTE — Telephone Encounter (Signed)
Spoke with the pt r/t refill for Adderrall. Advised the pt of the provider's note, Patient has requested refill too soon per P.A. Pumphrey. Pt acknowledges understanding and voices no further concerns at this time.

## 2015-09-15 ENCOUNTER — Encounter

## 2015-09-15 MED ORDER — AMPHETAMINE-DEXTROAMPHETAMINE 30 MG TAB
30 mg | ORAL_TABLET | Freq: Two times a day (BID) | ORAL | 0 refills | Status: DC
Start: 2015-09-15 — End: 2015-10-13

## 2015-10-13 ENCOUNTER — Encounter

## 2015-10-13 NOTE — Telephone Encounter (Signed)
PT also states that they want to be put back on Wellbutrin.

## 2015-10-14 MED ORDER — AMPHETAMINE-DEXTROAMPHETAMINE 30 MG TAB
30 mg | ORAL_TABLET | Freq: Two times a day (BID) | ORAL | 0 refills | Status: DC
Start: 2015-10-14 — End: 2015-11-03

## 2015-10-14 MED ORDER — BUPROPION XL 300 MG 24 HR TAB
300 mg | ORAL_TABLET | ORAL | 3 refills | Status: DC
Start: 2015-10-14 — End: 2016-09-15

## 2015-10-15 NOTE — Telephone Encounter (Signed)
Left Message to PT regarding his prescription that needs to be picked up.

## 2015-11-03 ENCOUNTER — Ambulatory Visit
Admit: 2015-11-03 | Discharge: 2015-11-03 | Payer: PRIVATE HEALTH INSURANCE | Attending: Medical | Primary: Nurse Practitioner

## 2015-11-03 DIAGNOSIS — M25532 Pain in left wrist: Secondary | ICD-10-CM

## 2015-11-03 MED ORDER — NAPROXEN 500 MG TAB
500 mg | ORAL_TABLET | Freq: Two times a day (BID) | ORAL | 0 refills | Status: DC
Start: 2015-11-03 — End: 2016-02-01

## 2015-11-03 MED ORDER — PREDNISONE 10 MG TAB
10 mg | ORAL_TABLET | ORAL | 0 refills | Status: DC
Start: 2015-11-03 — End: 2016-02-01

## 2015-11-03 MED ORDER — AMPHETAMINE-DEXTROAMPHETAMINE 30 MG TAB
30 mg | ORAL_TABLET | Freq: Two times a day (BID) | ORAL | 0 refills | Status: DC
Start: 2015-11-03 — End: 2015-12-07

## 2015-11-03 NOTE — Patient Instructions (Signed)
Joint Pain: Care Instructions  Your Care Instructions  Many people have small aches and pains from overuse or injury to muscles and joints. Joint injuries often happen during sports or recreation, work tasks, or projects around the home. An overuse injury can happen when you put too much stress on a joint or when you do an activity that stresses the joint over and over, such as using the computer or rowing a boat.  You can take action at home to help your muscles and joints get better. You should feel better in 1 to 2 weeks, but it can take 3 months or more to heal completely.  Follow-up care is a key part of your treatment and safety. Be sure to make and go to all appointments, and call your doctor if you are having problems. It's also a good idea to know your test results and keep a list of the medicines you take.  How can you care for yourself at home?  ?? Do not put weight on the injured joint for at least a day or two.  ?? For the first day or two after an injury, do not take hot showers or baths, and do not use hot packs. The heat could make swelling worse.  ?? Put ice or a cold pack on the sore joint for 10 to 20 minutes at a time. Try to do this every 1 to 2 hours for the next 3 days (when you are awake) or until the swelling goes down. Put a thin cloth between the ice and your skin.  ?? Wrap the injury in an elastic bandage. Do not wrap it too tightly because this can cause more swelling.  ?? Prop up the sore joint on a pillow when you ice it or anytime you sit or lie down during the next 3 days. Try to keep it above the level of your heart. This will help reduce swelling.  ?? Take an over-the-counter pain medicine, such as acetaminophen (Tylenol), ibuprofen (Advil, Motrin), or naproxen (Aleve). Read and follow all instructions on the label.  ?? After 1 or 2 days of rest, begin moving the joint gently. While the joint is still healing, you can begin to exercise using activities that do  not strain or hurt the painful joint.  When should you call for help?  Call your doctor now or seek immediate medical care if:  ?? You have signs of infection, such as:  ?? Increased pain, swelling, warmth, and redness.  ?? Red streaks leading from the joint.  ?? A fever.  Watch closely for changes in your health, and be sure to contact your doctor if:  ?? Your movement or symptoms are not getting better after 1 to 2 weeks of home treatment.  Where can you learn more?  Go to http://www.healthwise.net/GoodHelpConnections.  Enter P205 in the search box to learn more about "Joint Pain: Care Instructions."  Current as of: Apr 05, 2015  Content Version: 11.1  ?? 2006-2016 [employer]. Care instructions adapted under license by [employer] (which disclaims liability or warranty for this information). If you have questions about a medical condition or this instruction, always ask your healthcare professional. [employer]wise, Incorporated disclaims any warranty or liability for your use of this information.

## 2015-11-03 NOTE — Progress Notes (Signed)
History and Physical    Patient: Bobby Reid MRN: [id number]  SSN: [id number]    Date of Birth: 11-Oct-1986  Age: 29 y.o.  Sex: male      Subjective:      Bobby Reid is a 29 y.o. male who is presenting for a 3 week h/o left wrist/hand pain, swelling and numbness and tingling.  Patient states that he has been using a drill over the past 3 weeks that he belives exacerbated his symptoms.  He has not tried anything for his symptoms so far.    In terms of his depression, he is stable on Wellbutrin, still has days where he may be down but otherwise he feels good.    In terms of his ADD, he states the adderall 30 mg BID is working well for him          PMH:  Past Medical History   Diagnosis Date   ??? ADD (attention deficit disorder)    ??? Depression      History reviewed. No pertinent past surgical history.     FamHx:  Family History   Problem Relation Age of Onset   ??? Alcohol abuse Mother    ??? Anxiety Father        Socialhx:  Social History   Substance Use Topics   ??? Smoking status: Current Every Day Smoker     Packs/day: 0.25     Years: 15.00   ??? Smokeless tobacco: Never Used   ??? Alcohol use No      Comment: social         Meds:  Prior to Admission medications    Medication Sig Start Date End Date Taking? Authorizing Provider   dextroamphetamine-amphetamine (ADDERALL) 30 mg tablet Take 1 Tab by mouth two (2) times a day. Max Daily Amount: 2 Tabs. 11/03/15  Yes Cheyann Blecha V, PA   predniSONE (DELTASONE) 10 mg tablet Take 6 tablets on day 1, 5 tablets on day 2, 4 tablets on day 3, 3 tablets on day 4, 2 tablets on day 5, 1 tablets on day 6 11/03/15  Yes Samyrah Bruster V, PA   naproxen (NAPROSYN) 500 mg tablet Take 1 Tab by mouth two (2) times daily (with meals). 11/03/15  Yes Gena Laski V, PA   buPROPion XL (WELLBUTRIN XL) 300 mg XL tablet Take 1 Tab by mouth every morning. 10/14/15  Yes Royetta Crochet V, PA        Allergies:  Allergies   Allergen Reactions   ??? Penicillins Hives        Review of Systems:  Items in bold are positive:  Constitutional: negative for fevers, chills and malaise  Eyes: negative for visual disturbance  Ears, Nose, Mouth, Throat, and Face: negative for nasal congestion  Respiratory: negative for cough or SOB  Cardiovascular: negative for chest pain, chest pressure/discomfort  Gastrointestinal: negative for nausea, vomiting, melena, BRBPR, diarrhea, constipation and abdominal pain  Genitourinary:negative for frequency, dysuria, hesitancy and decreased stream  Musculoskeletal: left wrist/hand pain, swelling, numbness/tingling   Neurological: negative for headaches, dizziness and paresthesia  Psych: depression-stable, adhd-stable, denies si/hi    Objective:     Vitals:    11/03/15 1523   BP: 137/88   Pulse: 79   Resp: 20   Temp: 98 ??F (36.7 ??C)   TempSrc: Oral   SpO2: 98%   Weight: 152 lb 6.4 oz (69.1 kg)   Height:  (1.803 m)        Physical Exam:  GENERAL: alert, cooperative, no distress, appears stated age  HEENT: EYE: conjunctivae/corneas clear. PERRL, EOM's intact.   LUNG: clear to auscultation bilaterally  HEART: regular rate and rhythm, S1, S2 normal, no murmur, click, rub or gallop  EXTREMITIES:?? right hand with visible edema, no overlying erythema or increased warmth, (+) tinel's sign, is able to flex and extend fingers, decreased sensation in 4th, left finger, ttp along top of hand  NEUROLOGIC: AOx3. Gait normal.        Assessment and Plan:       ICD-10-CM ICD-9-CM    1. Left wrist pain M25.532 719.43 predniSONE (DELTASONE) 10 mg tablet      naproxen (NAPROSYN) 500 mg tablet   2. ADD (attention deficit disorder) F98.8 314.00 dextroamphetamine-amphetamine (ADDERALL) 30 mg tablet   3. Severe episode of recurrent major depressive disorder, without psychotic features (HCC) F33.2 296.33          Medical Decision Making:  Left wrist pain/swelling- naprosyn + prednisone    ADD- refill of adderall    Depression- continue current therapy- well controlled     Follow-up Disposition:  Return in about 3 months (around 02/01/2016) for routine care with me.      Patient acknowledges understanding of instructions and acknowledges understanding to call back if current symptoms worsen or new symptoms arise.  Patient acknowledges and agrees with plan.    Signed By: June LeapJessica Holley Wirt V, PA     November 03, 2015

## 2015-11-03 NOTE — Progress Notes (Signed)
Paul DykesJoshua Racanelli is here today with complaints of left wrist pain, throbbing pain radiating up his arm for 3 weeks.     1. Have you been to the ER, urgent care clinic since your last visit?  Hospitalized since your last visit?No    2. Have you seen or consulted any other health care providers outside of the [facility] since your last visit?  Include any pap smears or colon screening. No

## 2015-11-10 NOTE — Telephone Encounter (Signed)
Patient called stating pharm,acy will into let him fill script fp adderal. I called pharmacy and pharmacist in formed me he last filled script on Oct 15, 2015 therefore cannot fill until the 30 th. notified patient. Patient states he is out relayed script instructions to only take 2 tabs max daily so that he doesn't run out.

## 2015-12-07 ENCOUNTER — Encounter

## 2015-12-07 MED ORDER — AMPHETAMINE-DEXTROAMPHETAMINE 30 MG TAB
30 mg | ORAL_TABLET | Freq: Two times a day (BID) | ORAL | 0 refills | Status: DC
Start: 2015-12-07 — End: 2016-01-05

## 2015-12-07 NOTE — Telephone Encounter (Signed)
Patient needs scheduled follow up

## 2015-12-07 NOTE — Telephone Encounter (Signed)
Patient states he is out of his pills

## 2015-12-08 NOTE — Telephone Encounter (Signed)
adderall was already filled, put on LPN janet's desk

## 2015-12-14 ENCOUNTER — Encounter: Attending: Medical | Primary: Nurse Practitioner

## 2016-01-05 ENCOUNTER — Encounter

## 2016-01-05 MED ORDER — AMPHETAMINE-DEXTROAMPHETAMINE 30 MG TAB
30 mg | ORAL_TABLET | Freq: Two times a day (BID) | ORAL | 0 refills | Status: DC
Start: 2016-01-05 — End: 2016-02-01

## 2016-01-05 NOTE — Telephone Encounter (Signed)
Please have patient schedule follow up that is due for next month
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
## 2016-02-01 ENCOUNTER — Ambulatory Visit: Admit: 2016-02-01 | Payer: PRIVATE HEALTH INSURANCE | Attending: Medical | Primary: Nurse Practitioner

## 2016-02-01 ENCOUNTER — Inpatient Hospital Stay: Admit: 2016-02-01 | Payer: Self-pay | Primary: Nurse Practitioner

## 2016-02-01 DIAGNOSIS — F988 Other specified behavioral and emotional disorders with onset usually occurring in childhood and adolescence: Secondary | ICD-10-CM

## 2016-02-01 DIAGNOSIS — R05 Cough: Secondary | ICD-10-CM

## 2016-02-01 LAB — CBC WITH AUTOMATED DIFF
ABS. BASOPHILS: 0 10*3/uL (ref 0.0–0.06)
ABS. EOSINOPHILS: 0.2 10*3/uL (ref 0.0–0.4)
ABS. LYMPHOCYTES: 3.2 10*3/uL (ref 0.9–3.6)
ABS. MONOCYTES: 1.2 10*3/uL (ref 0.05–1.2)
ABS. NEUTROPHILS: 5.1 10*3/uL (ref 1.8–8.0)
BASOPHILS: 0 % (ref 0–2)
EOSINOPHILS: 2 % (ref 0–5)
HCT: 44 % (ref 36.0–48.0)
HGB: 14.8 g/dL (ref 13.0–16.0)
LYMPHOCYTES: 33 % (ref 21–52)
MCH: 31.6 PG (ref 24.0–34.0)
MCHC: 33.6 g/dL (ref 31.0–37.0)
MCV: 94 FL (ref 74.0–97.0)
MONOCYTES: 12 % — ABNORMAL HIGH (ref 3–10)
MPV: 10 FL (ref 9.2–11.8)
NEUTROPHILS: 53 % (ref 40–73)
PLATELET COMMENTS: ADEQUATE
PLATELET: 231 10*3/uL (ref 135–420)
RBC: 4.68 M/uL — ABNORMAL LOW (ref 4.70–5.50)
RDW: 13.4 % (ref 11.6–14.5)
WBC: 9.7 10*3/uL (ref 4.6–13.2)

## 2016-02-01 LAB — METABOLIC PANEL, COMPREHENSIVE
A-G Ratio: 1.2 (ref 0.8–1.7)
ALT (SGPT): 21 U/L (ref 16–61)
AST (SGOT): 16 U/L (ref 15–37)
Albumin: 4.3 g/dL (ref 3.4–5.0)
Alk. phosphatase: 93 U/L (ref 45–117)
Anion gap: 7 mmol/L (ref 3.0–18)
BUN/Creatinine ratio: 17 (ref 12–20)
BUN: 16 MG/DL (ref 7.0–18)
Bilirubin, total: 0.2 MG/DL (ref 0.2–1.0)
CO2: 28 mmol/L (ref 21–32)
Calcium: 9 MG/DL (ref 8.5–10.1)
Chloride: 105 mmol/L (ref 100–108)
Creatinine: 0.94 MG/DL (ref 0.6–1.3)
GFR est AA: >60 mL/min/{1.73_m2} (ref >60)
GFR est non-AA: >60 mL/min/{1.73_m2} (ref >60)
Globulin: 3.6 g/dL (ref 2.0–4.0)
Glucose: 90 mg/dL (ref 74–99)
Potassium: 3.7 mmol/L (ref 3.5–5.5)
Protein, total: 7.9 g/dL (ref 6.4–8.2)
Sodium: 140 mmol/L (ref 136–145)

## 2016-02-01 MED ORDER — AMPHETAMINE-DEXTROAMPHETAMINE 30 MG TAB
30 mg | ORAL_TABLET | Freq: Two times a day (BID) | ORAL | 0 refills | Status: DC
Start: 2016-02-01 — End: 2016-02-28

## 2016-02-01 NOTE — Progress Notes (Signed)
Please advise labs look good

## 2016-02-01 NOTE — Progress Notes (Signed)
History and Physical    Patient: Bobby DykesJoshua Nolting MRN: [id number]  SSN: [id number]    Date of Birth: July 02, 1986  Age: 30 y.o.  Sex: male      Subjective:      Bobby DykesJoshua Totty is a 30 y.o. male who is presenting for follow up of depression and add.      In terms of his depression, he was taking Wellbutrin, but he states that he could not afford the medication, so he stopped.  He states that he will still get down but he manages it well.  He denies si/hi.  Overall he is feeling well, he has started a new job at [employer].  He is also going to pursue getting a GED.   ??  In terms of his ADD, he states the adderall 30 mg BID is working well for him, he states that he notices a difference when he is not on it, is less concentrated.  He states that when he takes it he is able to complete tasks both at home and at work.      Patient is complaining of a month long h/o night sweats and coughing.  Patient states that he started Vaping to try to quit smoking, but since that time he has noticed that when he will cough he coughs up a thick, mucous.  He states his cough is not a regular cough, it is more of a throat clearing cough.  He states that he will also have sweats at night for about a month, unsure if it is because of his blanket.  He denies feeling ill or fevers etc.   Patient states he did receive a TB test 2-3 months ago when he spent a night in jail and was negative.  Patient denies being recently homeless or having been around anyone that has been homeless.        PMH:  Past Medical History:   Diagnosis Date   ??? ADD (attention deficit disorder)    ??? Depression      History reviewed. No pertinent surgical history.     FamHx:  Family History   Problem Relation Age of Onset   ??? Alcohol abuse Mother    ??? Anxiety Father        Socialhx:  Social History   Substance Use Topics   ??? Smoking status: Current Every Day Smoker     Packs/day: 0.25     Years: 15.00   ??? Smokeless tobacco: Never Used   ??? Alcohol use No       Comment: social         Meds:  Prior to Admission medications    Medication Sig Start Date End Date Taking? Authorizing Provider   dextroamphetamine-amphetamine (ADDERALL) 30 mg tablet Take 1 Tab by mouth two (2) times a dayEarliest Fill Date: 02/01/16.  Max Daily Amount: 2 Tabs 02/01/16  Yes Ranette Luckadoo V, PA   buPROPion XL (WELLBUTRIN XL) 300 mg XL tablet Take 1 Tab by mouth every morning. 10/14/15  Yes Royetta CrochetJessica Gavyn Zoss V, PA        Allergies:  Allergies   Allergen Reactions   ??? Penicillins Hives       Review of Systems:  Items in bold are positive:  Constitutional: negative for fevers, chills and malaise  Eyes: negative for visual disturbance  Ears, Nose, Mouth, Throat, and Face: negative for nasal congestion  Respiratory: occasional cough up phlegm, negative for SOB  Cardiovascular: negative for chest pain, chest pressure/discomfort  Gastrointestinal: negative for nausea, vomiting, melena, BRBPR, diarrhea, constipation and abdominal pain  Genitourinary:negative for frequency, dysuria, hesitancy and decreased stream  Musculoskeletal:negative for myalgias and arthralgias  Neurological: negative for headaches, dizziness and paresthesia  Psych: ADD- controlled, depression-stable, denies si/hi    Objective:     Vitals:    02/01/16 0925   BP: 115/83   Pulse: 96   Resp: 18   Temp: 97.8 ??F (36.6 ??C)   TempSrc: Oral   SpO2: 100%   Weight: 157 lb (71.2 kg)   Height:  (1.803 m)        Physical Exam:  GENERAL: alert, cooperative, no distress, appears stated age  HEENT: EYE: conjunctivae/corneas clear. PERRL, EOM's intact. EAR: TM's pearly gray bilaterally NOSE: Nasal mucosa pink and moist bilaterally, THROAT: no erythema or edema, 1+ bilateral tonsillar hypertrohpy  THYROID: no thyromegaly  NECK: no adenopathy  LUNG: clear to auscultation bilaterally  HEART: regular rate and rhythm, S1, S2 normal, no murmur, click, rub or gallop  ABDOMEN: soft, non-tender. Bowel sounds normal. No masses   EXTREMITIES:?? extremities normal, atraumatic, no cyanosis or edema  NEUROLOGIC: AOx3. Gait normal.  PSYCH: mood: neutral affect: happy      Assessment and Plan:       ICD-10-CM ICD-9-CM    1. ADD (attention deficit disorder) F98.8 314.00 dextroamphetamine-amphetamine (ADDERALL) 30 mg tablet      METABOLIC PANEL, COMPREHENSIVE   2. Major depressive disorder with single episode, in partial remission (HCC) F32.4 296.25    3. Cough R05 786.2 CBC WITH AUTOMATED DIFF   4. Tobacco abuse Z72.0 305.1          Medical Decision Making:  ADD- refill Adderall + labs- PMP reviewed    Depression- patient stable- patient may continue to stay off medication as he is reporting he feels stable     Cough- cbc- unable to obtain in office PPD secondary to lack of supplies- lungs are clear, denies fevers or feeling ill    Tobacco abuse- patient trying to cut back by vaping    Follow-up Disposition:  Return in about 3 months (around 05/03/2016) for routine care with me.      Patient acknowledges understanding of instructions and acknowledges understanding to call back if current symptoms worsen or new symptoms arise.  Patient acknowledges and agrees with plan.    Signed By: June Leap, PA     February 01, 2016

## 2016-02-01 NOTE — Patient Instructions (Signed)
Attention Deficit Hyperactivity Disorder (ADHD) in Adults: Care Instructions  Your Care Instructions  Attention deficit hyperactivity disorder, or ADHD, is a condition that makes it hard to pay attention. So you may have problems when you try to focus, get organized, and finish tasks. It might make you more active than other people. Or you might do things without thinking first.  ADHD is very common. It usually starts in early childhood. Many adults don't realize they have it until their children are diagnosed. Then they become aware of their own symptoms.  Doctors don't know what causes ADHD. But it often runs in families.  ADHD can be treated with medicines, behavior training, and counseling. Treatment can improve your life.  Follow-up care is a key part of your treatment and safety. Be sure to make and go to all appointments, and call your doctor if you are having problems. It's also a good idea to know your test results and keep a list of the medicines you take.  How can you care for yourself at home?  ?? Learn all you can about ADHD. This will help you and your family understand it better.  ?? Take your medicines exactly as prescribed. Call your doctor if you think you are having a problem with your medicine. You will get more details on the specific medicines your doctor prescribes.  ?? If you miss a dose of your medicine, do not take an extra dose.  ?? If your doctor suggests counseling, find a counselor you like and trust. Talk openly and honestly. Be willing to make some changes.  ?? Find a support group for adults with ADHD. Talking to others with the same problems can help you feel better. It can also give you ideas about how to best cope with the condition.  ?? Get rid of distractions at your work space. Keep your desk clean. Try not to face a window or busy hallway.  ?? Use files, planners, and other tools to keep you organized.  ?? Limit use of alcohol, and do not use illegal drugs. People with ADHD  tend to become addicted more easily than others. Tell your doctor if you need help to quit. Counseling, support groups, and sometimes medicines can help you stay free of alcohol or drugs.  ?? Get at least 30 minutes of physical activity on most days of the week. Exercise has been shown to help people cope with ADHD. Walking is a good choice. You also may want to do other activities, such as running, swimming, cycling, or playing tennis or team sports.  When should you call for help?  Watch closely for changes in your health, and be sure to contact your doctor if:  ?? You feel sad a lot or cry all the time.  ?? You have trouble sleeping, or you sleep too much.  ?? You find it hard to concentrate, make decisions, or remember things.  ?? You change how you normally eat.  ?? You feel guilty for no reason.  Where can you learn more?  Go to http://www.healthwise.net/GoodHelpConnections.  Enter B196 in the search box to learn more about "Attention Deficit Hyperactivity Disorder (ADHD) in Adults: Care Instructions."  Current as of: June 08, 2015  Content Version: 11.1  ?? 2006-2016 [employer]. Care instructions adapted under license by [employer] (which disclaims liability or warranty for this information). If you have questions about a medical condition or this instruction, always ask your healthcare professional. [employer] disclaims any warranty   or liability for your use of this information.

## 2016-02-02 NOTE — Telephone Encounter (Signed)
Attempted to contact patient Bobby DykesJoshua Reid regarding lab finding will send letter.  Call back number left and I myself or one of the other nurses will attempt to contact again.

## 2016-02-03 ENCOUNTER — Encounter: Attending: Medical | Primary: Nurse Practitioner

## 2016-02-17 NOTE — Telephone Encounter (Signed)
Result letter sent

## 2016-02-28 ENCOUNTER — Encounter

## 2016-02-29 MED ORDER — AMPHETAMINE-DEXTROAMPHETAMINE 30 MG TAB
30 mg | ORAL_TABLET | Freq: Two times a day (BID) | ORAL | 0 refills | Status: DC
Start: 2016-02-29 — End: 2016-03-28

## 2016-02-29 NOTE — Telephone Encounter (Signed)
PMP reviewed, refill approved

## 2016-03-28 ENCOUNTER — Encounter

## 2016-03-28 MED ORDER — AMPHETAMINE-DEXTROAMPHETAMINE 30 MG TAB
30 mg | ORAL_TABLET | Freq: Two times a day (BID) | ORAL | 0 refills | Status: DC
Start: 2016-03-28 — End: 2016-04-24

## 2016-03-28 NOTE — Telephone Encounter (Signed)
PMP reviewed, refill approved

## 2016-04-24 ENCOUNTER — Encounter

## 2016-04-24 MED ORDER — AMPHETAMINE-DEXTROAMPHETAMINE 30 MG TAB
30 mg | ORAL_TABLET | Freq: Two times a day (BID) | ORAL | 0 refills | Status: DC
Start: 2016-04-24 — End: 2016-05-22

## 2016-04-24 NOTE — Telephone Encounter (Signed)
pmp pulled, refill approved

## 2016-04-24 NOTE — Telephone Encounter (Signed)
Pt requesting refill for Adderall. Please advise.

## 2016-05-22 ENCOUNTER — Encounter

## 2016-05-22 MED ORDER — AMPHETAMINE-DEXTROAMPHETAMINE 30 MG TAB
30 mg | ORAL_TABLET | Freq: Two times a day (BID) | ORAL | 0 refills | Status: DC
Start: 2016-05-22 — End: 2016-06-19

## 2016-05-22 NOTE — Telephone Encounter (Signed)
PMP reviewed, approved, however, he will need to schedule a follow up visit for future refills

## 2016-05-23 NOTE — Telephone Encounter (Signed)
Called pt and left message. Call back number left and I myself or one of the other nurses will attempt to contact again.    The call was to inform pt prescription is signed and up at the front.

## 2016-06-19 ENCOUNTER — Encounter

## 2016-06-20 MED ORDER — AMPHETAMINE-DEXTROAMPHETAMINE 30 MG TAB
30 mg | ORAL_TABLET | Freq: Two times a day (BID) | ORAL | 0 refills | Status: DC
Start: 2016-06-20 — End: 2016-07-18

## 2016-06-20 NOTE — Telephone Encounter (Signed)
PMP reviewed, refill approved

## 2016-06-20 NOTE — Telephone Encounter (Signed)
Called and left message for patient to pick up signed prescription which is up at the front in prescription box.

## 2016-07-18 ENCOUNTER — Ambulatory Visit
Admit: 2016-07-18 | Discharge: 2016-07-18 | Payer: PRIVATE HEALTH INSURANCE | Attending: Nurse Practitioner | Primary: Nurse Practitioner

## 2016-07-18 DIAGNOSIS — F988 Other specified behavioral and emotional disorders with onset usually occurring in childhood and adolescence: Secondary | ICD-10-CM

## 2016-07-18 MED ORDER — AMPHETAMINE-DEXTROAMPHETAMINE 30 MG TAB
30 mg | ORAL_TABLET | Freq: Two times a day (BID) | ORAL | 0 refills | Status: DC
Start: 2016-07-18 — End: 2016-08-14

## 2016-07-18 NOTE — Progress Notes (Signed)
Patient is here today for a refill of his Adderall. He is doing well he has no complaints. He is compliant with his medication and takes it as directed. He is in  No distress. He denies suicidal/homicidal ideation.  He is to follow up in one month.
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
## 2016-08-14 ENCOUNTER — Encounter

## 2016-08-16 MED ORDER — AMPHETAMINE-DEXTROAMPHETAMINE 30 MG TAB
30 mg | ORAL_TABLET | Freq: Two times a day (BID) | ORAL | 0 refills | Status: DC
Start: 2016-08-16 — End: 2016-09-15

## 2016-08-16 NOTE — Telephone Encounter (Signed)
Called and left message for patient to pick up signed prescription which is up at the front in prescription box

## 2016-09-13 ENCOUNTER — Encounter

## 2016-09-13 NOTE — Telephone Encounter (Signed)
Called pt and left message. Call back number left and I myself or one of the other nurses will attempt to contact again.    The call was to inform pt medication refill and scheduling an apt

## 2016-09-14 ENCOUNTER — Encounter: Attending: Medical | Primary: Nurse Practitioner

## 2016-09-15 ENCOUNTER — Ambulatory Visit
Admit: 2016-09-15 | Discharge: 2016-09-15 | Payer: PRIVATE HEALTH INSURANCE | Attending: Medical | Primary: Nurse Practitioner

## 2016-09-15 DIAGNOSIS — F988 Other specified behavioral and emotional disorders with onset usually occurring in childhood and adolescence: Secondary | ICD-10-CM

## 2016-09-15 MED ORDER — AMPHETAMINE-DEXTROAMPHETAMINE 30 MG TAB
30 mg | ORAL_TABLET | Freq: Two times a day (BID) | ORAL | 0 refills | Status: DC
Start: 2016-09-15 — End: 2016-10-10

## 2016-09-15 MED ORDER — CITALOPRAM 20 MG TAB
20 mg | ORAL_TABLET | Freq: Every day | ORAL | 3 refills | Status: AC
Start: 2016-09-15 — End: ?

## 2016-09-15 MED ORDER — CITALOPRAM 20 MG TAB
20 mg | ORAL_TABLET | Freq: Every day | ORAL | 3 refills | Status: DC
Start: 2016-09-15 — End: 2016-09-15

## 2016-09-15 NOTE — Progress Notes (Signed)
History and Physical    Patient: Bobby DykesJoshua Milks MRN: [id number]  SSN: [id number]    Date of Birth: Oct 11, 1986  Age: 30 y.o.  Sex: male      Subjective:      Bobby DykesJoshua Broeker is a 30 y.o. male who is presenting today for evaluation for ADD, depression, tobacco abuse etc    In terms of his ADD, he states the adderall 30 mg BID is working well for him, he states that he notices a difference when he is not on it, is less concentrated.  He states that when he takes it he is able to complete tasks both at home and at work.    In terms of his depression, he was previously on Wellbutrin but could not afford medication, he does feel depressed on occasion, especially since his twin brother moved out of state, denies si/hi.    In terms of his tobacco abuse, he states that he has cut back. He was vaping but stopped that as it was making him sick.      PMH:  Past Medical History:   Diagnosis Date   ??? ADD (attention deficit disorder)    ??? Depression      No past surgical history on file.     FamHx:  Family History   Problem Relation Age of Onset   ??? Alcohol abuse Mother    ??? Anxiety Father        Socialhx:  Social History   Substance Use Topics   ??? Smoking status: Current Every Day Smoker     Packs/day: 0.25     Years: 15.00   ??? Smokeless tobacco: Never Used   ??? Alcohol use No      Comment: social         Meds:  Prior to Admission medications    Medication Sig Start Date End Date Taking? Authorizing Provider   dextroamphetamine-amphetamine (ADDERALL) 30 mg tablet Take 1 Tab by mouth two (2) times a dayEarliest Fill Date: 09/15/16.  Max Daily Amount: 2 Tabs 09/15/16  Yes Shandy Checo V, PA   citalopram (CELEXA) 20 mg tablet Take 1 Tab by mouth daily. 09/15/16  Yes Royetta CrochetJessica Queena Monrreal V, PA        Allergies:  Allergies   Allergen Reactions   ??? Penicillins Hives       Review of Systems:  Items in bold are positive:  Constitutional: negative for fevers, chills and malaise  Eyes: negative for visual disturbance   Ears, Nose, Mouth, Throat, and Face: negative for nasal congestion  Respiratory: negative for cough or SOB  Cardiovascular: negative for chest pain, chest pressure/discomfort  Gastrointestinal: negative for nausea, vomiting, melena, BRBPR, diarrhea, constipation and abdominal pain  Genitourinary:negative for frequency, dysuria, hesitancy and decreased stream  Musculoskeletal:negative for myalgias and arthralgias  Neurological: negative for headaches, dizziness and paresthesia  Psych: ADD- controlled, depression-active, denies si/hi    Objective:     Vitals:    09/15/16 0841   BP: (!) 139/95   Pulse: 99   Resp: 16   Temp: 98.5 ??F (36.9 ??C)   TempSrc: Oral   SpO2: 96%   Weight: 168 lb (76.2 kg)   Height: 5\' 11"  (1.803 m)        Physical Exam:  GENERAL: alert, cooperative, no distress, appears stated age  HEENT: EYE: conjunctivae/corneas clear.   LUNG: clear to auscultation bilaterally  HEART: regular rate and rhythm, S1, S2 normal, no murmur, click, rub or gallop  ABDOMEN: soft, non-tender.  Bowel sounds normal. No masses  EXTREMITIES:?? extremities normal, atraumatic, no cyanosis or edema  NEUROLOGIC: AOx3. Gait normal.  PSYCH: mood: neutral affect: happy        Assessment and Plan:       ICD-10-CM ICD-9-CM    1. Attention deficit disorder, unspecified hyperactivity presence F98.8 314.00 dextroamphetamine-amphetamine (ADDERALL) 30 mg tablet   2. Severe episode of recurrent major depressive disorder, without psychotic features (HCC) F33.2 296.33 citalopram (CELEXA) 20 mg tablet      DISCONTINUED: citalopram (CELEXA) 20 mg tablet   3. Elevated BP without diagnosis of hypertension R03.0 796.2    4. Tobacco abuse Z72.0 305.1          Medical Decision Making:  ADD- refill Adderall + labs- PMP reviewed    Depression- start celexa- patient advised of ED precautions regarding development of suicidal ideations, support number given- recheck in 1 month    Elevated BP- patient advised to cut back on amount of caffeine he is  drinking, recheck in 1 week    Tobacco abuse- patient trying to cut back on his own    Follow-up Disposition:  Return in about 1 week (around 09/22/2016) for for nurse visit BP. then in 4 weeks for condition recheck of depression      Patient acknowledges understanding of instructions and acknowledges understanding to call back if current symptoms worsen or new symptoms arise.  Patient acknowledges and agrees with plan.    Signed By: June LeapJessica Dnya Hickle V, PA     September 15, 2016

## 2016-09-15 NOTE — Patient Instructions (Signed)
Suicidal Thoughts and Behavior: Care Instructions  Your Care Instructions    You have been seen by a doctor because you've had thoughts about killing yourself. Maybe you have tried to do it. This is much different from having fleeting thoughts of death, which many people have from time to time. Your doctor and support team will work hard to help keep you safe. Your team may include a case manager, a social worker, and a counselor.  Most people who think about suicide don't want to die. They think suicide will end their problems and pain. People who consider suicide often feel hopeless, helpless, and worthless. These thoughts can make a person feel that there is no other choice.  But you do have a choice. Help is always available. The doctor and staff members are taking you and your pain very seriously. It is important to remember that there are people who are willing and able to talk with you about your suicidal thoughts. Treatment and close follow-up care can help you feel better about life.  Thoughts of hopelessness and suicide may come from being depressed. Depression is a medical condition. When you have depression, there may be problems with activity levels in certain parts of your brain. Chemicals in your brain called neurotransmitters may be out of balance. But depression can be treated. Treatment for depression includes counseling, medicines, and lifestyle changes. With treatment, you can feel better.  Your doctor doesn't want you to hurt yourself. He or she may ask you to sign a "no harm" agreement or contract. This contract is your promise that you will not hurt yourself between now and your next visit. Be completely honest with your doctor if you feel that you can't sign it. You will get help.  Follow-up care is a key part of your treatment and safety. Be sure to make and go to all appointments, and call your doctor if you are having  problems. It's also a good idea to know your test results and keep a list of the medicines you take.  How can you care for yourself at home?  ?? Talk to someone. Reach out to family members, friends, your doctor, or a counselor. Be open and honest with them about your thoughts and feelings.  ?? Be safe with medicines. Take your medicines exactly as prescribed. Call your doctor if you think you are having a problem with your medicine.  ?? Avoid illegal drugs and alcohol.  ?? Attend all counseling sessions recommended by your doctor.  ?? Have someone take away sharp or dangerous objects, guns, and drugs from your home.  ?? Keep the numbers for these national suicide hotlines: 1-800-273-TALK (1-800-273-8255) and 1-800-SUICIDE (1-800-784-2433).  When should you call for help?  Call 911 anytime you think you may need emergency care. For example, call if:  ? ?? You feel you cannot stop from hurting yourself or someone else.   ?Call your doctor now or seek immediate medical care if:  ? ?? You have one or more warning signs of suicide. For example, call if:  ?? You feel like giving away your possessions.  ?? You use illegal drugs or drink alcohol heavily.  ?? You talk or write about death. This may include writing suicide notes and talking about guns, knives, or pills.  ?? You start to spend a lot of time alone or spend more time alone than usual.   ? ?? You hear voices.   ? ?? You start acting in an aggressive   way that's not normal for you.   ?Watch closely for changes in your health, and be sure to contact your doctor if you have any problems.  Where can you learn more?  Go to InsuranceStats.cahttp://www.healthwise.net/GoodHelpConnections.  Enter 940-539-9058G672 in the search box to learn more about "Suicidal Thoughts and Behavior: Care Instructions."  Current as of: Mar 24, 2016  Content Version: 11.4  ?? 2006-2017 [employer]. Care instructions adapted under license by [employer] (which disclaims liability or warranty  for this information). If you have questions about a medical condition or this instruction, always ask your healthcare professional. Healthwise, Incorporated disclaims any warranty or liability for your use of this information.       Depression and Chronic Disease: Care Instructions  Your Care Instructions    A chronic disease is one that you have for a long time. Some chronic diseases can be controlled, but they usually cannot be cured. Depression is common in people with chronic diseases, but it often goes unnoticed.  Many people have concerns about seeking treatment for a mental health problem. You may think it's a sign of weakness, or you don't want people to know about it. It's important to overcome these reasons for not seeking treatment. Treating depression or anxiety is good for your health.  Follow-up care is a key part of your treatment and safety. Be sure to make and go to all appointments, and call your doctor if you are having problems. It's also a good idea to know your test results and keep a list of the medicines you take.  How can you care for yourself at home?  Watch for symptoms of depression  The symptoms of depression are often subtle at first. You may think they are caused by your disease rather than depression. Or you may think it is normal to be depressed when you have a chronic disease.  If you are depressed you may:  ?? Feel sad or hopeless.  ?? Feel guilty or worthless.  ?? Not enjoy the things you used to enjoy.  ?? Feel hopeless, as though life is not worth living.  ?? Have trouble thinking or remembering.  ?? Have low energy, and you may not eat or sleep well.  ?? Pull away from others.  ?? Think often about death or killing yourself. (Keep the numbers for these national suicide hotlines: 1-800-273-TALK [1-(631)774-8577] and 1-800-SUICIDE [1-765-123-9240].)  Get treatment  By treating your depression, you can feel more hopeful and have more  energy. If you feel better, you may take better care of yourself, so your health may improve.  ?? Talk to your doctor if you have any changes in mood during treatment for your disease.  ?? Ask your doctor for help. Counseling, antidepressant medicine, or a combination of the two can help most people with depression. Often a combination works best. Counseling can also help you cope with having a chronic disease.  When should you call for help?  Call 911 anytime you think you may need emergency care. For example, call if:  ? ?? You feel like hurting yourself or someone else.   ? ?? Someone you know has depression and is about to attempt or is attempting suicide.   ?Call your doctor now or seek immediate medical care if:  ? ?? You hear voices.   ? ?? Someone you know has depression and:  ?? Starts to give away his or her possessions.  ?? Uses illegal drugs or drinks alcohol heavily.  ??  Talks or writes about death, including writing suicide notes or talking about guns, knives, or pills.  ?? Starts to spend a lot of time alone.  ?? Acts very aggressively or suddenly appears calm.   ?Watch closely for changes in your health, and be sure to contact your doctor if:  ? ?? You do not get better as expected.   Where can you learn more?  Go to http://wwwInsuranceStats.ca.healthwise.net/GoodHelpConnections.  Enter 8734541648A548 in the search box to learn more about "Depression and Chronic Disease: Care Instructions."  Current as of: Mar 24, 2016  Content Version: 11.4  ?? 2006-2017 [employer]. Care instructions adapted under license by [employer] (which disclaims liability or warranty for this information). If you have questions about a medical condition or this instruction, always ask your healthcare professional. Healthwise, Incorporated disclaims any warranty or liability for your use of this information.

## 2016-09-15 NOTE — Progress Notes (Signed)
Paul DykesJoshua Gloor is a 30 y.o.  male presents today for office visit for medication refill. Pt is in Room # 8      1. Have you been to the ER, urgent care clinic since your last visit?  Hospitalized since your last visit?No    2. Have you seen or consulted any other health care providers outside of the [facility] since your last visit?  Include any pap smears or colon screening. No    Health Maintenance reviewed

## 2016-10-10 ENCOUNTER — Encounter

## 2016-10-10 MED ORDER — AMPHETAMINE-DEXTROAMPHETAMINE 30 MG TAB
30 mg | ORAL_TABLET | Freq: Two times a day (BID) | ORAL | 0 refills | Status: DC
Start: 2016-10-10 — End: 2016-11-08

## 2016-10-10 NOTE — Telephone Encounter (Signed)
PMP reviewed, refill authorized

## 2016-10-11 NOTE — Telephone Encounter (Signed)
Left message for patient to pick up signed prescription up at the front in prescription box.

## 2016-11-08 ENCOUNTER — Encounter

## 2016-11-08 MED ORDER — AMPHETAMINE-DEXTROAMPHETAMINE 30 MG TAB
30 mg | ORAL_TABLET | Freq: Two times a day (BID) | ORAL | 0 refills | Status: DC
Start: 2016-11-08 — End: 2016-12-05

## 2016-11-08 NOTE — Telephone Encounter (Signed)
Patient picked up prescription today

## 2016-11-08 NOTE — Telephone Encounter (Signed)
PMP reviewed, approved

## 2016-12-05 ENCOUNTER — Encounter

## 2016-12-06 ENCOUNTER — Encounter

## 2016-12-06 MED ORDER — AMPHETAMINE-DEXTROAMPHETAMINE 30 MG TAB
30 mg | ORAL_TABLET | Freq: Two times a day (BID) | ORAL | 0 refills | Status: DC
Start: 2016-12-06 — End: 2017-01-01

## 2016-12-06 NOTE — Progress Notes (Signed)
Patient called office requesting a refill of Adderall. He was diagnosed with depression and ADHD in 2016 when he came to this office to establish care. She is currently on Adderall 30 mg bid and Celexa 20 mg daily. A psych evaluation was requested due to none being on file in his chart.  His medications will continue at the recommendations of his psych evaluation.    Bobby StallElizabeth Peja Allender NP-C  [facility]  [address]  [place], [place] [id number]  Phone [phone]  Fax [phone]

## 2016-12-07 NOTE — Telephone Encounter (Signed)
PMP reviewed by NP.Signed prescription which is up at the front in prescription box. Also informed for furture refills of mediction patient need to be seen by psych. Referral already placed.    Called pt and left message. Call back number left and I myself or one of the other nurses will attempt to contact again.    The call was to inform pt medication evaluation

## 2017-01-01 ENCOUNTER — Encounter

## 2017-01-01 NOTE — Telephone Encounter (Signed)
Paitent called in a refill request for Adderall.  He is scheduled to see Dr. Standley DakinsAleea Maye 01/25/2017 @ 11:00am.

## 2017-01-03 MED ORDER — AMPHETAMINE-DEXTROAMPHETAMINE 30 MG TAB
30 mg | ORAL_TABLET | Freq: Two times a day (BID) | ORAL | 0 refills | Status: AC
Start: 2017-01-03 — End: ?

## 2017-01-03 NOTE — Telephone Encounter (Signed)
PMP reviewed called patient to pick up signed prescription.

## 2017-01-25 ENCOUNTER — Encounter: Attending: Internal Medicine | Primary: Nurse Practitioner

## 2017-01-31 ENCOUNTER — Encounter

## 2017-01-31 NOTE — Telephone Encounter (Signed)
Patient states he had to reschedule his appt for Dr Sherrilyn RistMaye for march 29th

## 2017-02-08 ENCOUNTER — Encounter: Attending: Internal Medicine | Primary: Nurse Practitioner

## 2017-02-08 NOTE — Telephone Encounter (Signed)
Patient called and stated that he had to reschedule his appointment today, 3/29 with Dr. Sherrilyn Rist because he is out of town working in [place].  His next appointment is not scheduled until 03/22/2017.  He has been with out his Adderall for a couple of weeks and stated that he is having a lot of problem since he has not taken it.  He would like to know if you can writ him a prescription for it until he can see Dr. Sherrilyn Rist.  Please call him back at [phone].

## 2017-02-13 NOTE — Telephone Encounter (Signed)
Spoke with patient for PCP still refusing the refill of adderall due to patient has not seen Dr.Maye for evaluation referral has been in since [place]. Patient continues to reschedule/cancel.

## 2017-03-20 ENCOUNTER — Inpatient Hospital Stay (HOSPITAL_COMMUNITY): Payer: Worker's Compensation

## 2017-03-20 ENCOUNTER — Encounter (HOSPITAL_COMMUNITY)
Admission: EM | Disposition: A | Discharge status: Home / Self Care | Payer: Self-pay | Source: Home / Self Care | Attending: Orthopedic Surgery

## 2017-03-20 ENCOUNTER — Emergency Department (HOSPITAL_COMMUNITY): Payer: Worker's Compensation

## 2017-03-20 ENCOUNTER — Encounter (HOSPITAL_COMMUNITY): Payer: Self-pay

## 2017-03-20 ENCOUNTER — Emergency Department (HOSPITAL_COMMUNITY): Payer: Worker's Compensation | Admitting: Certified Registered"

## 2017-03-20 ENCOUNTER — Inpatient Hospital Stay (HOSPITAL_COMMUNITY)
Admission: EM | Admit: 2017-03-20 | Discharge: 2017-03-26 | DRG: 494 | Disposition: A | Discharge status: Home / Self Care | Payer: Worker's Compensation | Attending: Orthopedic Surgery | Admitting: Orthopedic Surgery

## 2017-03-20 DIAGNOSIS — S82221B Displaced transverse fracture of shaft of right tibia, initial encounter for open fracture type I or II: Secondary | ICD-10-CM | POA: Diagnosis present

## 2017-03-20 DIAGNOSIS — S93622A Sprain of tarsometatarsal ligament of left foot, initial encounter: Secondary | ICD-10-CM

## 2017-03-20 DIAGNOSIS — S82221A Displaced transverse fracture of shaft of right tibia, initial encounter for closed fracture: Secondary | ICD-10-CM | POA: Diagnosis present

## 2017-03-20 DIAGNOSIS — Y9269 Other specified industrial and construction area as the place of occurrence of the external cause: Secondary | ICD-10-CM | POA: Diagnosis not present

## 2017-03-20 DIAGNOSIS — Z419 Encounter for procedure for purposes other than remedying health state, unspecified: Secondary | ICD-10-CM

## 2017-03-20 DIAGNOSIS — W208XXA Other cause of strike by thrown, projected or falling object, initial encounter: Secondary | ICD-10-CM | POA: Diagnosis present

## 2017-03-20 DIAGNOSIS — S82201B Unspecified fracture of shaft of right tibia, initial encounter for open fracture type I or II: Secondary | ICD-10-CM | POA: Diagnosis present

## 2017-03-20 DIAGNOSIS — S92322A Displaced fracture of second metatarsal bone, left foot, initial encounter for closed fracture: Secondary | ICD-10-CM | POA: Diagnosis present

## 2017-03-20 DIAGNOSIS — S93692A Other sprain of left foot, initial encounter: Secondary | ICD-10-CM | POA: Diagnosis present

## 2017-03-20 DIAGNOSIS — Z880 Allergy status to penicillin: Secondary | ICD-10-CM | POA: Diagnosis not present

## 2017-03-20 DIAGNOSIS — Z09 Encounter for follow-up examination after completed treatment for conditions other than malignant neoplasm: Secondary | ICD-10-CM

## 2017-03-20 DIAGNOSIS — S92302A Fracture of unspecified metatarsal bone(s), left foot, initial encounter for closed fracture: Secondary | ICD-10-CM

## 2017-03-20 HISTORY — PX: I & D EXTREMITY: SHX5045

## 2017-03-20 HISTORY — PX: TIBIA IM NAIL INSERTION: SHX2516

## 2017-03-20 LAB — CBC
HEMATOCRIT: 41.6 % (ref 39.0–52.0)
HEMOGLOBIN: 14 g/dL (ref 13.0–17.0)
MCH: 31 pg (ref 26.0–34.0)
MCHC: 33.7 g/dL (ref 30.0–36.0)
MCV: 92 fL (ref 78.0–100.0)
Platelets: 215 10*3/uL (ref 150–400)
RBC: 4.52 MIL/uL (ref 4.22–5.81)
RDW: 13.3 % (ref 11.5–15.5)
WBC: 12.5 10*3/uL — ABNORMAL HIGH (ref 4.0–10.5)

## 2017-03-20 LAB — BASIC METABOLIC PANEL
Anion gap: 9 (ref 5–15)
BUN: 15 mg/dL (ref 6–20)
CHLORIDE: 106 mmol/L (ref 101–111)
CO2: 24 mmol/L (ref 22–32)
Calcium: 9.6 mg/dL (ref 8.9–10.3)
Creatinine, Ser: 1.11 mg/dL (ref 0.61–1.24)
GFR calc Af Amer: >60 mL/min (ref >60)
GFR calc non Af Amer: >60 mL/min (ref >60)
GLUCOSE: 97 mg/dL (ref 65–99)
POTASSIUM: 4 mmol/L (ref 3.5–5.1)
Sodium: 139 mmol/L (ref 135–145)

## 2017-03-20 SURGERY — INSERTION, INTRAMEDULLARY ROD, TIBIA
Anesthesia: General | Site: Leg Lower | Laterality: Right

## 2017-03-20 MED ORDER — PROMETHAZINE HCL 25 MG/ML IJ SOLN: 6.2500 mg | INTRAMUSCULAR | Status: DC | PRN

## 2017-03-20 MED ORDER — OXYCODONE HCL 5 MG PO TABS
ORAL_TABLET | ORAL | Status: AC
  Filled 2017-03-20: qty 2

## 2017-03-20 MED ORDER — FENTANYL CITRATE (PF) 100 MCG/2ML IJ SOLN
INTRAMUSCULAR | Status: AC
  Filled 2017-03-20: qty 2

## 2017-03-20 MED ORDER — MORPHINE SULFATE (PF) 4 MG/ML IV SOLN
2.0000 mg | INTRAVENOUS | Status: DC | PRN
  Administered 2017-03-20 – 2017-03-23 (×8): 2 mg via INTRAVENOUS
  Filled 2017-03-20 (×8): qty 1

## 2017-03-20 MED ORDER — EPHEDRINE 5 MG/ML INJ
INTRAVENOUS | Status: AC
  Filled 2017-03-20: qty 10

## 2017-03-20 MED ORDER — ENOXAPARIN SODIUM 40 MG/0.4ML ~~LOC~~ SOLN
40.0000 mg | SUBCUTANEOUS | Status: DC
  Administered 2017-03-20: 40 mg via SUBCUTANEOUS

## 2017-03-20 MED ORDER — METOCLOPRAMIDE HCL 5 MG/ML IJ SOLN: 5.0000 mg | Freq: Three times a day (TID) | INTRAMUSCULAR | Status: DC | PRN

## 2017-03-20 MED ORDER — ONDANSETRON HCL 4 MG/2ML IJ SOLN: 4.0000 mg | Freq: Four times a day (QID) | INTRAMUSCULAR | Status: DC | PRN

## 2017-03-20 MED ORDER — HYDROCODONE-ACETAMINOPHEN 5-325 MG PO TABS
1.0000 | ORAL_TABLET | ORAL | Status: DC | PRN
  Administered 2017-03-21 – 2017-03-26 (×8): 2 via ORAL
  Filled 2017-03-20 (×8): qty 2

## 2017-03-20 MED ORDER — VANCOMYCIN HCL IN DEXTROSE 1-5 GM/200ML-% IV SOLN: 1000.0000 mg | INTRAVENOUS | Status: AC

## 2017-03-20 MED ORDER — ACETAMINOPHEN 325 MG PO TABS: 650.0000 mg | ORAL_TABLET | Freq: Four times a day (QID) | ORAL | Status: DC | PRN

## 2017-03-20 MED ORDER — FENTANYL CITRATE (PF) 250 MCG/5ML IJ SOLN
INTRAMUSCULAR | Status: AC
  Filled 2017-03-20: qty 5

## 2017-03-20 MED ORDER — CLINDAMYCIN PHOSPHATE 600 MG/50ML IV SOLN
600.0000 mg | Freq: Four times a day (QID) | INTRAVENOUS | Status: AC
Start: 2017-03-21 — End: 2017-03-21
  Administered 2017-03-21 (×4): 600 mg via INTRAVENOUS
  Filled 2017-03-20 (×4): qty 50

## 2017-03-20 MED ORDER — DIPHENHYDRAMINE HCL 12.5 MG/5ML PO ELIX
12.5000 mg | ORAL_SOLUTION | ORAL | Status: DC | PRN
  Administered 2017-03-20 – 2017-03-24 (×5): 25 mg via ORAL
  Filled 2017-03-20 (×6): qty 10

## 2017-03-20 MED ORDER — ONDANSETRON HCL 4 MG PO TABS: 4.0000 mg | ORAL_TABLET | Freq: Four times a day (QID) | ORAL | Status: DC | PRN

## 2017-03-20 MED ORDER — ACETAMINOPHEN 650 MG RE SUPP: 650.0000 mg | Freq: Four times a day (QID) | RECTAL | Status: DC | PRN

## 2017-03-20 MED ORDER — LIDOCAINE 2% (20 MG/ML) 5 ML SYRINGE
INTRAMUSCULAR | Status: AC
  Filled 2017-03-20: qty 5

## 2017-03-20 MED ORDER — HYDROMORPHONE HCL 1 MG/ML IJ SOLN
0.5000 mg | INTRAMUSCULAR | Status: DC | PRN
  Administered 2017-03-23 – 2017-03-26 (×15): 1 mg via INTRAVENOUS
  Filled 2017-03-20 (×15): qty 1

## 2017-03-20 MED ORDER — HYDROMORPHONE HCL 1 MG/ML IJ SOLN
INTRAMUSCULAR | Status: AC
  Filled 2017-03-20: qty 1

## 2017-03-20 MED ORDER — SUGAMMADEX SODIUM 200 MG/2ML IV SOLN
INTRAVENOUS | Status: AC
  Filled 2017-03-20: qty 2

## 2017-03-20 MED ORDER — ROCURONIUM BROMIDE 100 MG/10ML IV SOLN
INTRAVENOUS | Status: DC | PRN
  Administered 2017-03-20: 50 mg via INTRAVENOUS

## 2017-03-20 MED ORDER — SODIUM CHLORIDE 0.9 % IV SOLN
INTRAVENOUS | Status: DC
  Administered 2017-03-20: 16:00:00 via INTRAVENOUS

## 2017-03-20 MED ORDER — PROPOFOL 10 MG/ML IV BOLUS
INTRAVENOUS | Status: DC | PRN
  Administered 2017-03-20: 160 mg via INTRAVENOUS
  Administered 2017-03-20: 40 mg via INTRAVENOUS

## 2017-03-20 MED ORDER — SODIUM CHLORIDE 0.9 % IV BOLUS (SEPSIS)
1000.0000 mL | Freq: Once | INTRAVENOUS | Status: AC
  Administered 2017-03-20: 1000 mL via INTRAVENOUS

## 2017-03-20 MED ORDER — CLINDAMYCIN PHOSPHATE 600 MG/50ML IV SOLN
600.0000 mg | Freq: Once | INTRAVENOUS | Status: AC
  Administered 2017-03-20: 600 mg via INTRAVENOUS
  Filled 2017-03-20: qty 50

## 2017-03-20 MED ORDER — POVIDONE-IODINE 10 % EX SWAB: 2.0000 "application " | Freq: Once | CUTANEOUS | Status: DC

## 2017-03-20 MED ORDER — ACETAMINOPHEN 325 MG PO TABS
650.0000 mg | ORAL_TABLET | Freq: Four times a day (QID) | ORAL | Status: DC | PRN
  Administered 2017-03-23: 650 mg via ORAL
  Filled 2017-03-20 (×2): qty 2

## 2017-03-20 MED ORDER — DOCUSATE SODIUM 100 MG PO CAPS
100.0000 mg | ORAL_CAPSULE | Freq: Two times a day (BID) | ORAL | Status: DC
  Administered 2017-03-20 – 2017-03-26 (×11): 100 mg via ORAL
  Filled 2017-03-20 (×12): qty 1

## 2017-03-20 MED ORDER — SODIUM CHLORIDE 0.9 % IV SOLN
INTRAVENOUS | Status: DC
  Administered 2017-03-20: 23:00:00 via INTRAVENOUS

## 2017-03-20 MED ORDER — PROPOFOL 10 MG/ML IV BOLUS
INTRAVENOUS | Status: AC
  Filled 2017-03-20: qty 20

## 2017-03-20 MED ORDER — ROCURONIUM BROMIDE 10 MG/ML (PF) SYRINGE
PREFILLED_SYRINGE | INTRAVENOUS | Status: AC
  Filled 2017-03-20: qty 5

## 2017-03-20 MED ORDER — MIDAZOLAM HCL 5 MG/5ML IJ SOLN
INTRAMUSCULAR | Status: DC | PRN
  Administered 2017-03-20: 2 mg via INTRAVENOUS

## 2017-03-20 MED ORDER — LACTATED RINGERS IV SOLN
INTRAVENOUS | Status: DC | PRN
  Administered 2017-03-20: 19:00:00 via INTRAVENOUS

## 2017-03-20 MED ORDER — POLYETHYLENE GLYCOL 3350 17 G PO PACK
17.0000 g | PACK | Freq: Every day | ORAL | Status: DC
  Administered 2017-03-22 – 2017-03-25 (×3): 17 g via ORAL
  Filled 2017-03-20 (×6): qty 1

## 2017-03-20 MED ORDER — FENTANYL CITRATE (PF) 100 MCG/2ML IJ SOLN
INTRAMUSCULAR | Status: AC | PRN
  Administered 2017-03-20: 100 ug via INTRAVENOUS
  Administered 2017-03-20: 50 ug via INTRAVENOUS

## 2017-03-20 MED ORDER — ONDANSETRON HCL 4 MG/2ML IJ SOLN
INTRAMUSCULAR | Status: AC
  Filled 2017-03-20: qty 2

## 2017-03-20 MED ORDER — SUCCINYLCHOLINE CHLORIDE 20 MG/ML IJ SOLN
INTRAMUSCULAR | Status: DC | PRN
  Administered 2017-03-20: 140 mg via INTRAVENOUS

## 2017-03-20 MED ORDER — HYDROMORPHONE HCL 1 MG/ML IJ SOLN
0.2500 mg | INTRAMUSCULAR | Status: DC | PRN
  Administered 2017-03-20 (×3): 0.5 mg via INTRAVENOUS

## 2017-03-20 MED ORDER — SUGAMMADEX SODIUM 200 MG/2ML IV SOLN
INTRAVENOUS | Status: DC | PRN
  Administered 2017-03-20: 200 mg via INTRAVENOUS

## 2017-03-20 MED ORDER — VANCOMYCIN HCL IN DEXTROSE 1-5 GM/200ML-% IV SOLN
1000.0000 mg | Freq: Two times a day (BID) | INTRAVENOUS | Status: AC
  Administered 2017-03-20: 1000 mg via INTRAVENOUS
  Filled 2017-03-20: qty 200

## 2017-03-20 MED ORDER — KETOROLAC TROMETHAMINE 15 MG/ML IJ SOLN
15.0000 mg | Freq: Four times a day (QID) | INTRAMUSCULAR | Status: AC
  Administered 2017-03-20 – 2017-03-21 (×3): 15 mg via INTRAVENOUS
  Filled 2017-03-20 (×3): qty 1

## 2017-03-20 MED ORDER — LIDOCAINE HCL (CARDIAC) 20 MG/ML IV SOLN
INTRAVENOUS | Status: DC | PRN
  Administered 2017-03-20: 80 mg via INTRAVENOUS

## 2017-03-20 MED ORDER — OXYCODONE HCL 5 MG PO TABS
5.0000 mg | ORAL_TABLET | ORAL | Status: DC | PRN
  Administered 2017-03-20 – 2017-03-22 (×5): 10 mg via ORAL
  Administered 2017-03-22: 15 mg via ORAL
  Administered 2017-03-22: 10 mg via ORAL
  Administered 2017-03-23 – 2017-03-25 (×14): 15 mg via ORAL
  Administered 2017-03-26: 10 mg via ORAL
  Administered 2017-03-26 (×2): 15 mg via ORAL
  Filled 2017-03-20 (×5): qty 3
  Filled 2017-03-20: qty 2
  Filled 2017-03-20 (×3): qty 3
  Filled 2017-03-20 (×2): qty 2
  Filled 2017-03-20: qty 3
  Filled 2017-03-20: qty 2
  Filled 2017-03-20: qty 1
  Filled 2017-03-20 (×7): qty 3
  Filled 2017-03-20: qty 2
  Filled 2017-03-20: qty 3

## 2017-03-20 MED ORDER — 0.9 % SODIUM CHLORIDE (POUR BTL) OPTIME
TOPICAL | Status: DC | PRN
  Administered 2017-03-20: 1000 mL

## 2017-03-20 MED ORDER — ENOXAPARIN SODIUM 40 MG/0.4ML ~~LOC~~ SOLN
40.0000 mg | SUBCUTANEOUS | Status: DC
  Administered 2017-03-21 – 2017-03-26 (×6): 40 mg via SUBCUTANEOUS
  Filled 2017-03-20 (×7): qty 0.4

## 2017-03-20 MED ORDER — HYDROMORPHONE HCL 1 MG/ML IJ SOLN
1.0000 mg | Freq: Once | INTRAMUSCULAR | Status: AC
  Administered 2017-03-20: 1 mg via INTRAVENOUS

## 2017-03-20 MED ORDER — METOCLOPRAMIDE HCL 5 MG PO TABS: 5.0000 mg | ORAL_TABLET | Freq: Three times a day (TID) | ORAL | Status: DC | PRN

## 2017-03-20 MED ORDER — PHENYLEPHRINE 40 MCG/ML (10ML) SYRINGE FOR IV PUSH (FOR BLOOD PRESSURE SUPPORT)
PREFILLED_SYRINGE | INTRAVENOUS | Status: AC
  Filled 2017-03-20: qty 10

## 2017-03-20 MED ORDER — LACTATED RINGERS IV SOLN
INTRAVENOUS | Status: DC
  Administered 2017-03-20: 23:00:00 via INTRAVENOUS

## 2017-03-20 MED ORDER — METHOCARBAMOL 1000 MG/10ML IJ SOLN
500.0000 mg | Freq: Four times a day (QID) | INTRAVENOUS | Status: DC | PRN
  Filled 2017-03-20: qty 5

## 2017-03-20 MED ORDER — POLYETHYLENE GLYCOL 3350 17 G PO PACK: 17.0000 g | PACK | Freq: Every day | ORAL | Status: DC | PRN

## 2017-03-20 MED ORDER — MEPERIDINE HCL 25 MG/ML IJ SOLN: 6.2500 mg | INTRAMUSCULAR | Status: DC | PRN

## 2017-03-20 MED ORDER — MIDAZOLAM HCL 2 MG/2ML IJ SOLN
INTRAMUSCULAR | Status: AC
  Filled 2017-03-20: qty 2

## 2017-03-20 MED ORDER — METHOCARBAMOL 500 MG PO TABS
500.0000 mg | ORAL_TABLET | Freq: Four times a day (QID) | ORAL | Status: DC | PRN
  Administered 2017-03-20 – 2017-03-26 (×18): 500 mg via ORAL
  Filled 2017-03-20 (×17): qty 1

## 2017-03-20 MED ORDER — FENTANYL CITRATE (PF) 100 MCG/2ML IJ SOLN
INTRAMUSCULAR | Status: DC | PRN
  Administered 2017-03-20 (×2): 50 ug via INTRAVENOUS
  Administered 2017-03-20: 100 ug via INTRAVENOUS
  Administered 2017-03-20 (×2): 50 ug via INTRAVENOUS

## 2017-03-20 MED ORDER — METHOCARBAMOL 500 MG PO TABS
ORAL_TABLET | ORAL | Status: AC
  Filled 2017-03-20: qty 1

## 2017-03-20 MED ORDER — ONDANSETRON HCL 4 MG PO TABS
4.0000 mg | ORAL_TABLET | Freq: Four times a day (QID) | ORAL | Status: DC | PRN
  Administered 2017-03-26: 4 mg via ORAL
  Filled 2017-03-20: qty 1

## 2017-03-20 SURGICAL SUPPLY — 84 items
BANDAGE ACE 3X5.8 VEL STRL LF (GAUZE/BANDAGES/DRESSINGS) IMPLANT
BANDAGE ACE 4X5 VEL STRL LF (GAUZE/BANDAGES/DRESSINGS) ×3 IMPLANT
BANDAGE ACE 6X5 VEL STRL LF (GAUZE/BANDAGES/DRESSINGS) ×3 IMPLANT
BANDAGE ESMARK 6X9 LF (GAUZE/BANDAGES/DRESSINGS) ×1 IMPLANT
BIT DRILL 3.8X6 NS (BIT) ×3 IMPLANT
BIT DRILL 4.4 NS (BIT) ×3 IMPLANT
BNDG COHESIVE 1X5 TAN STRL LF (GAUZE/BANDAGES/DRESSINGS) IMPLANT
BNDG COHESIVE 4X5 TAN STRL (GAUZE/BANDAGES/DRESSINGS) ×3 IMPLANT
BNDG COHESIVE 6X5 TAN STRL LF (GAUZE/BANDAGES/DRESSINGS) ×6 IMPLANT
BNDG CONFORM 3 STRL LF (GAUZE/BANDAGES/DRESSINGS) IMPLANT
BNDG ESMARK 6X9 LF (GAUZE/BANDAGES/DRESSINGS) ×3
BNDG GAUZE STRTCH 6 (GAUZE/BANDAGES/DRESSINGS) ×3 IMPLANT
CORDS BIPOLAR (ELECTRODE) IMPLANT
COVER MAYO STAND STRL (DRAPES) ×3 IMPLANT
COVER SURGICAL LIGHT HANDLE (MISCELLANEOUS) ×6 IMPLANT
CUFF TOURNIQUET SINGLE 24IN (TOURNIQUET CUFF) IMPLANT
CUFF TOURNIQUET SINGLE 34IN LL (TOURNIQUET CUFF) ×3 IMPLANT
CUFF TOURNIQUET SINGLE 44IN (TOURNIQUET CUFF) IMPLANT
DRAPE C-ARM 42X72 X-RAY (DRAPES) ×3 IMPLANT
DRAPE C-ARMOR (DRAPES) ×3 IMPLANT
DRAPE HALF SHEET 40X57 (DRAPES) ×3 IMPLANT
DRAPE IMP U-DRAPE 54X76 (DRAPES) ×3 IMPLANT
DRAPE INCISE IOBAN 66X45 STRL (DRAPES) IMPLANT
DRAPE POUCH INSTRU U-SHP 10X18 (DRAPES) ×3 IMPLANT
DRAPE U-SHAPE 47X51 STRL (DRAPES) ×6 IMPLANT
DRAPE UTILITY XL STRL (DRAPES) ×6 IMPLANT
DRSG PAD ABDOMINAL 8X10 ST (GAUZE/BANDAGES/DRESSINGS) ×3 IMPLANT
DURAPREP 26ML APPLICATOR (WOUND CARE) ×6 IMPLANT
ELECT CAUTERY BLADE 6.4 (BLADE) ×3 IMPLANT
ELECT REM PT RETURN 9FT ADLT (ELECTROSURGICAL) ×3
ELECTRODE REM PT RTRN 9FT ADLT (ELECTROSURGICAL) ×1 IMPLANT
FACESHIELD WRAPAROUND (MASK) IMPLANT
FACESHIELD WRAPAROUND OR TEAM (MASK) IMPLANT
GAUZE SPONGE 4X4 12PLY STRL (GAUZE/BANDAGES/DRESSINGS) ×3 IMPLANT
GAUZE XEROFORM 1X8 LF (GAUZE/BANDAGES/DRESSINGS) ×6 IMPLANT
GAUZE XEROFORM 5X9 LF (GAUZE/BANDAGES/DRESSINGS) ×3 IMPLANT
GLOVE SKINSENSE NS SZ7.5 (GLOVE) ×12
GLOVE SKINSENSE STRL SZ7.5 (GLOVE) ×6 IMPLANT
GOWN STRL REIN XL XLG (GOWN DISPOSABLE) ×9 IMPLANT
GUIDEPIN 3.2X17.5 THRD DISP (PIN) ×3 IMPLANT
GUIDEWIRE BALL NOSE 80CM (WIRE) ×3 IMPLANT
HANDPIECE INTERPULSE COAX TIP (DISPOSABLE)
KIT BASIN OR (CUSTOM PROCEDURE TRAY) ×6 IMPLANT
KIT ROOM TURNOVER OR (KITS) ×3 IMPLANT
MANIFOLD NEPTUNE II (INSTRUMENTS) ×6 IMPLANT
NAIL TIBIAL 10MMX36CM (Nail) ×3 IMPLANT
NS IRRIG 1000ML POUR BTL (IV SOLUTION) ×3 IMPLANT
PACK ORTHO EXTREMITY (CUSTOM PROCEDURE TRAY) ×3 IMPLANT
PACK TOTAL JOINT (CUSTOM PROCEDURE TRAY) ×3 IMPLANT
PACK UNIVERSAL I (CUSTOM PROCEDURE TRAY) ×3 IMPLANT
PAD ABD 8X10 STRL (GAUZE/BANDAGES/DRESSINGS) ×3 IMPLANT
PAD ARMBOARD 7.5X6 YLW CONV (MISCELLANEOUS) ×6 IMPLANT
PAD CAST 4YDX4 CTTN HI CHSV (CAST SUPPLIES) ×1 IMPLANT
PADDING CAST ABS 4INX4YD NS (CAST SUPPLIES) ×2
PADDING CAST ABS COTTON 4X4 ST (CAST SUPPLIES) ×1 IMPLANT
PADDING CAST COTTON 4X4 STRL (CAST SUPPLIES) ×2
PADDING CAST COTTON 6X4 STRL (CAST SUPPLIES) ×3 IMPLANT
SCREW ACECAP 36MM (Screw) ×3 IMPLANT
SCREW ACECAP 46MM (Screw) ×3 IMPLANT
SCREW CORTICAL 5.5 35MM (Screw) ×3 IMPLANT
SET HNDPC FAN SPRY TIP SCT (DISPOSABLE) IMPLANT
SPONGE LAP 18X18 X RAY DECT (DISPOSABLE) ×3 IMPLANT
STAPLER SKIN PROX WIDE 3.9 (STAPLE) ×3 IMPLANT
STAPLER VISISTAT 35W (STAPLE) ×3 IMPLANT
STOCKINETTE IMPERVIOUS 9X36 MD (GAUZE/BANDAGES/DRESSINGS) ×3 IMPLANT
SUT ETHILON 2 0 FS 18 (SUTURE) ×12 IMPLANT
SUT ETHILON 2 0 PSLX (SUTURE) ×3 IMPLANT
SUT VIC AB 0 CT1 27 (SUTURE) ×4
SUT VIC AB 0 CT1 27XBRD ANTBC (SUTURE) ×2 IMPLANT
SUT VIC AB 1 CT1 27 (SUTURE) ×2
SUT VIC AB 1 CT1 27XBRD ANBCTR (SUTURE) ×1 IMPLANT
SUT VIC AB 2-0 CT1 27 (SUTURE) ×4
SUT VIC AB 2-0 CT1 TAPERPNT 27 (SUTURE) ×2 IMPLANT
SUT VIC AB 2-0 FS1 27 (SUTURE) ×9 IMPLANT
SWAB CULTURE ESWAB REG 1ML (MISCELLANEOUS) IMPLANT
TOWEL OR 17X24 6PK STRL BLUE (TOWEL DISPOSABLE) ×3 IMPLANT
TOWEL OR 17X26 10 PK STRL BLUE (TOWEL DISPOSABLE) ×9 IMPLANT
TUBE CONNECTING 12'X1/4 (SUCTIONS) ×1
TUBE CONNECTING 12X1/4 (SUCTIONS) ×2 IMPLANT
TUBE FEEDING ENTERAL 5FR 16IN (TUBING) IMPLANT
TUBING CYSTO DISP (UROLOGICAL SUPPLIES) ×3 IMPLANT
UNDERPAD 30X30 (UNDERPADS AND DIAPERS) ×6 IMPLANT
WATER STERILE IRR 1000ML POUR (IV SOLUTION) ×6 IMPLANT
YANKAUER SUCT BULB TIP NO VENT (SUCTIONS) ×6 IMPLANT
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
## 2017-03-20 NOTE — Transfer of Care (Signed)
Immediate Anesthesia Transfer of Care Note  Patient: Ryan DykesJoshua Oconnell  Procedure(s) Performed: Procedure(s): INTRAMEDULLARY (IM) NAIL TIBIAL (Right) IRRIGATION AND DEBRIDEMENT RIGHT TIBIA (Right)  Patient Location: PACU  Anesthesia Type:General  Level of Consciousness: awake, alert , oriented and patient cooperative  Airway & Oxygen Therapy: Patient Spontanous Breathing and Patient connected to nasal cannula oxygen  Post-op Assessment: Report given to RN, Post -op Vital signs reviewed and stable and Patient moving all extremities  Post vital signs: Reviewed and stable  Last Vitals:  Vitals:   03/20/17 1645 03/20/17 1927  BP: 128/82   Pulse: 86 100  Resp: 17 10  Temp:  36.5 C    Last Pain:  Vitals:   03/20/17 1927  TempSrc:   PainSc: (P) Asleep         Complications: No apparent anesthesia complications

## 2017-03-20 NOTE — ED Provider Notes (Signed)
[facility]-EMERGENCY DEPT Provider Note   CSN: [id number] Arrival date & time: 03/20/17  1517     History   Chief Complaint No chief complaint on file.   HPI Ryan Oconnell is a 31 y.o. male.  The history is provided by the patient and the EMS personnel.  Trauma Mechanism of injury: "heavy beams" fell on legs Injury location: bilateral legs. 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 Time since incident: soon PTA. Arrived directly from scene: yes   Protective equipment:       None      Suspicion of alcohol use: no      Suspicion of drug use: no  EMS/PTA data:      Bystander interventions: splinting      Ambulatory at scene: no      Blood loss: minimal      Responsiveness: alert      Loss of consciousness: no      Amnesic to event: no      Airway interventions: none      IV access: established      IO access: none      Immobilization: C-collar, long board and RLE splint      Airway condition since incident: stable      Breathing condition since incident: stable      Circulation condition since incident: stable      Mental status condition since incident: stable      Disability condition since incident: stable  Current symptoms:      Pain timing: constant      Associated symptoms:            Denies loss of consciousness.   Relevant PMH:      Medical risk factors:            No asthma.       Pharmacological risk factors:            No anticoagulation therapy or antiplatelet therapy.       Tetanus status: UTD   History reviewed. No pertinent past medical history.  Patient Active Problem List   Diagnosis Date Noted  . Open right tibial fracture 03/20/2017  . Open displaced transverse fracture of shaft of right tibia 03/20/2017    History reviewed. No pertinent surgical history.     Home Medications    Prior to Admission medications   Medication Sig Start Date End Date Taking? Authorizing Provider  acetaminophen (TYLENOL) 500 MG tablet Take 500-1,000  mg by mouth every 6 (six) hours as needed (for pain or headaches).   Yes [provider]  ibuprofen (ADVIL,MOTRIN) 200 MG tablet Take 200-400 mg by mouth every 6 (six) hours as needed (for pain or headaches).   Yes [provider]    Family History History reviewed. No pertinent family history.  Social History Social History  Substance Use Topics  . Smoking status: Never Smoker  . Smokeless tobacco: Never Used  . Alcohol use Yes     Allergies   Penicillins   Review of Systems Review of Systems  Unable to perform ROS: Acuity of condition  Neurological: Negative for loss of consciousness.     Physical Exam Updated Vital Signs ED Triage Vitals  Enc Vitals Group     BP 03/20/17 1521 (!) 158/95     Pulse Rate 03/20/17 1522 63     Resp 03/20/17 1522 17     Temp 03/20/17 1525 98.5 F (36.9 C)     Temp Source 03/20/17 1525 Oral  SpO2 03/20/17 1522 97 %     Weight --      Height --      Head Circumference --      Peak Flow --      Pain Score 03/20/17 1524 10     Pain Loc --      Pain Edu? --      Excl. in GC? --      Physical Exam  Constitutional: He is oriented to person, place, and time. He appears well-developed and well-nourished. No distress.  HENT:  Head: Normocephalic and atraumatic.  Mouth/Throat: Oropharynx is clear and moist.  Eyes: Conjunctivae are normal. Pupils are equal, round, and reactive to light.  Neck:  Arrives in c-collar. No midline cspine ttp  Cardiovascular: Normal rate, regular rhythm and normal heart sounds.   No murmur heard. Pulmonary/Chest: Effort normal and breath sounds normal. No respiratory distress. He exhibits no tenderness.  Abdominal: Soft. There is no tenderness. There is no rebound and no guarding.  Musculoskeletal: He exhibits tenderness and deformity. He exhibits no edema.  Right lower extremity with a 90 angulation with an open wound to right medial lower leg. Faint DP pulses. Sensation is intact and  he is able to move toes. There is tenderness to palpation along left proximal lower leg with abrasions scattered throughout legs  Neurological: He is alert and oriented to person, place, and time. He exhibits normal muscle tone.  Skin: Skin is warm and dry. He is not diaphoretic.  Psychiatric: He has a normal mood and affect.  Nursing note and vitals reviewed.    ED Treatments / Results  Labs (all labs ordered are listed, but only abnormal results are displayed) Labs Reviewed  CBC - Abnormal; Notable for the following:       Result Value   WBC 12.5 (*)    All other components within normal limits  BASIC METABOLIC PANEL  HIV ANTIBODY (ROUTINE TESTING)  CBC  CREATININE, SERUM    EKG  EKG Interpretation None       Radiology   Procedures ORTHOPEDIC INJURY TREATMENT Date/Time: 03/20/2017 4:13 PM Performed by: Marijean Niemann Authorized by: Linwood Dibbles  Consent: The procedure was performed in an emergent situation. Verbal consent obtained. Written consent not obtained. Risks and benefits: risks, benefits and alternatives were discussed Consent given by: patient Patient identity confirmed: provided demographic data Injury location: lower leg Location details: right lower leg Injury type: fracture Fracture type: tibial and fibular shafts Pre-procedure distal perfusion: diminished Pre-procedure distal perfusion comment: faint DP pulses Pre-procedure neurological function: normal Pre-procedure range of motion: reduced  Sedation: Patient sedated: given fentanyl. Manipulation performed: yes Skeletal traction used: yes Reduction successful: yes (improved angulation of leg) X-ray confirmed reduction: yes Immobilization: splint Splint type: short leg Supplies used: Ortho-Glass Post-procedure distal perfusion: normal Post-procedure neurological function: normal Post-procedure range of motion comment: leg splinted Patient tolerance: Patient tolerated the procedure well with  no immediate complications    (including critical care time)  Medications Ordered in ED    Initial Impression / Assessment and Plan / ED Course  I have reviewed the triage vital signs and the nursing notes.  Pertinent labs & imaging results that were available during my care of the patient were reviewed by me and considered in my medical decision making (see chart for details).     Patient is a 31 year old male who presents with EMS with concerns of lower extremity trauma. He states that very heavy construction tiles fell on his  legs when he was sitting cross legged. Denies any head trauma or loss of consciousness. Only complains of lower extremity pain. GCS of 15 and ABCs intact. He has an obvious open fracture to his right lower extremity with a 90 angulation of his mid shaft tibia and fibula. Faint pulses plus emergent reduction done at the bedside with improved alignment and pulses. Orthopedics at bedside. Leg splinted. Additional x-rays obtained with metatarsal fractures on the left. Patient be taken to the OR with orthopedics. Given antibiotics here in the ED.   Final Clinical Impressions(s) / ED Diagnoses   Final diagnoses:  Surgery, elective  Postop check  open tib fib fracture trauma Left metatarsal fracture  New Prescriptions Current Discharge Medication List       Marijean Niemann, MD 03/20/17 2201

## 2017-03-20 NOTE — Progress Notes (Signed)
Orthopedic Tech Progress Note Patient Details:  Ryan DykesJoshua Oconnell 14-Jun-1986 [id number]  Ortho Devices Type of Ortho Device: Ace wrap, Stirrup splint, Post (short leg) splint Ortho Device/Splint Location: RLE Ortho Device/Splint Interventions: Ordered, Application   Ryan MoccasinHughes, Ryan Oconnell 03/20/2017, 3:47 PM

## 2017-03-20 NOTE — Progress Notes (Signed)
Vital signs taken by PACU RN

## 2017-03-20 NOTE — H&P (Signed)
Ryan DykesJoshua Oconnell is an 31 y.o. male.   Chief Complaint: Holiday representativeConstruction accident HPI: Sharia ReeveJosh was working when a stack of sheet metal fell onto him, knocking him backwards and striking his legs. One leg was behind the other and acted like a fulcrum causing the other to break. He c/o right lower leg pain and also left foot pain.  No past medical history on file.  No past surgical history on file.  No family history on file. Social History:  has no tobacco, alcohol, and drug history on file.  Allergies: Allergies not on file  No results found for this or any previous visit (from the past 48 hour(s)). No results found.  Review of Systems  Constitutional: Negative for weight loss.  HENT: Negative for ear discharge, ear pain, hearing loss and tinnitus.   Eyes: Negative for blurred vision, double vision, photophobia and pain.  Respiratory: Negative for cough, sputum production and shortness of breath.   Cardiovascular: Negative for chest pain.  Gastrointestinal: Negative for abdominal pain, nausea and vomiting.  Genitourinary: Negative for dysuria, flank pain, frequency and urgency.  Musculoskeletal: Positive for joint pain (Right tib/fib, left foot). Negative for back pain, falls, myalgias and neck pain.  Neurological: Negative for dizziness, tingling, sensory change, focal weakness, loss of consciousness and headaches.  Endo/Heme/Allergies: Does not bruise/bleed easily.  Psychiatric/Behavioral: Negative for depression, memory loss and substance abuse. The patient is not nervous/anxious.     Blood pressure (!) 158/95, pulse (!) 55, temperature 98.5 F (36.9 C), temperature source Oral, resp. rate 10, SpO2 100 %. Physical Exam  Constitutional: He appears well-developed and well-nourished. No distress.  HENT:  Head: Normocephalic.  Eyes: Conjunctivae are normal. Right eye exhibits no discharge. Left eye exhibits no discharge. No scleral icterus.  Neck: Normal range of motion.   Cardiovascular: Regular rhythm.  Tachycardia present.   Respiratory: Effort normal. No respiratory distress.  Musculoskeletal:  RLE Multiple abrasions, small puncture wound medial calf, no ecchymosis, or rash. Gross lower leg deformity, TTP or manipulation.  No effusions  Sens DPN, SPN, TN intact  Motor EHL, ext, flex, evers intact  DP 1+, no significant edema   LLE Multiple abrasions, no ecchymosis, or rash  TTP midfoot  No effusions  Knee stable to varus/ valgus and anterior/posterior stress  Sens DPN, SPN, TN intact  Motor EHL, ext, flex, evers 5/5  DP 2+, PT 2+, No significant edema  Neurological: He is alert.  Skin: Skin is warm and dry. He is not diaphoretic.  Psychiatric: He has a normal mood and affect. His behavior is normal.     Assessment/Plan Blunt lower extremity trauma Open right tib/fib fxs -- To OR with Dr. Linna CapriceSwinteck for I&D, IMN R tibia. Tetanus UTD, abx going in now. Left foot pain -- X-rays show mildly displaced MT necks 2 and 3 which are amenable to nonop treatment themselves - will obtain MRI L foot postop to r/o lisfranc.  Admit to Dr. Erlinda HongSwinteck    Michael J. Jeffery, PA-C Orthopedic Surgery [phone] 03/20/2017, 3:43 PM

## 2017-03-20 NOTE — Anesthesia Postprocedure Evaluation (Signed)
Anesthesia Post Note  Patient: Ryan DykesJoshua Oconnell  Procedure(s) Performed: Procedure(s) (LRB): INTRAMEDULLARY (IM) NAIL TIBIAL (Right) IRRIGATION AND DEBRIDEMENT RIGHT TIBIA (Right)  Patient location during evaluation: PACU Anesthesia Type: General Level of consciousness: awake Pain management: pain level controlled Vital Signs Assessment: post-procedure vital signs reviewed and stable Respiratory status: spontaneous breathing Cardiovascular status: stable Anesthetic complications: no       Last Vitals:  Vitals:   03/20/17 2025 03/20/17 2100  BP: (!) 154/82 (!) 161/91  Pulse: 60 82  Resp: 15 16  Temp: 36.7 C 36.7 C    Last Pain:  Vitals:   03/20/17 2240  TempSrc:   PainSc: 10-Worst pain ever                 Nthony Lefferts

## 2017-03-20 NOTE — Progress Notes (Signed)
Orthopedic Tech Progress Note Patient Details:  Paul DykesJoshua Maulden 06-19-1986 [id number]  Ortho Devices Type of Ortho Device: CAM walker Ortho Device/Splint Location: RLE Ortho Device/Splint Interventions: Ordered, Application   Jennye MoccasinHughes, Azura Tufaro Craig 03/20/2017, 7:52 PM

## 2017-03-20 NOTE — Anesthesia Procedure Notes (Addendum)
Procedure Name: Intubation Date/Time: 03/20/2017 5:41 PM Performed by: Salli Quarry Teagyn Fishel Pre-anesthesia Checklist: Patient identified, Emergency Drugs available, Suction available and Patient being monitored Patient Re-evaluated:Patient Re-evaluated prior to inductionOxygen Delivery Method: Circle System Utilized Preoxygenation: Pre-oxygenation with 100% oxygen Intubation Type: IV induction, Rapid sequence and Cricoid Pressure applied Laryngoscope Size: Mac and 4 Grade View: Grade I Tube type: Oral Tube size: 7.5 mm Number of attempts: 1 Airway Equipment and Method: Stylet Placement Confirmation: ETT inserted through vocal cords under direct vision,  positive ETCO2 and breath sounds checked- equal and bilateral Secured at: 23 cm Tube secured with: Tape Dental Injury: Teeth and Oropharynx as per pre-operative assessment

## 2017-03-20 NOTE — ED Triage Notes (Signed)
Pt arrived via [facility] from work injury to right leg

## 2017-03-20 NOTE — Anesthesia Preprocedure Evaluation (Signed)
Anesthesia Evaluation  Patient identified by MRN, date of birth, ID band Patient awake    Reviewed: Allergy & Precautions, NPO status , Patient's Chart, lab work & pertinent test results  Airway Mallampati: I  TM Distance: >3 FB Neck ROM: Full    Dental  (+) Teeth Intact, Dental Advisory Given   Pulmonary neg pulmonary ROS,    breath sounds clear to auscultation       Cardiovascular negative cardio ROS   Rhythm:Regular Rate:Normal     Neuro/Psych negative neurological ROS  negative psych ROS   GI/Hepatic negative GI ROS, Neg liver ROS,   Endo/Other  negative endocrine ROS  Renal/GU negative Renal ROS  negative genitourinary   Musculoskeletal negative musculoskeletal ROS (+)   Abdominal   Peds negative pediatric ROS (+)  Hematology negative hematology ROS (+)   Anesthesia Other Findings   Reproductive/Obstetrics negative OB ROS                             Anesthesia Physical Anesthesia Plan  ASA: II  Anesthesia Plan: General   Post-op Pain Management:    Induction: Intravenous, Rapid sequence and Cricoid pressure planned  Airway Management Planned: Oral ETT  Additional Equipment:   Intra-op Plan:   Post-operative Plan: Extubation in OR  Informed Consent: I have reviewed the patients History and Physical, chart, labs and discussed the procedure including the risks, benefits and alternatives for the proposed anesthesia with the patient or authorized representative who has indicated his/her understanding and acceptance.   Dental advisory given  Plan Discussed with: CRNA  Anesthesia Plan Comments: (Tobacco use today. )        Anesthesia Quick Evaluation

## 2017-03-20 NOTE — Progress Notes (Signed)
Received patient from PACU, assessment completed, VS documented, oriented patient to the room.  

## 2017-03-21 ENCOUNTER — Encounter (HOSPITAL_COMMUNITY): Payer: Self-pay | Admitting: Orthopedic Surgery

## 2017-03-21 ENCOUNTER — Inpatient Hospital Stay (HOSPITAL_COMMUNITY): Payer: Worker's Compensation

## 2017-03-21 LAB — HIV ANTIBODY (ROUTINE TESTING W REFLEX): HIV Screen 4th Generation wRfx: NONREACTIVE

## 2017-03-21 MED ORDER — PNEUMOCOCCAL VAC POLYVALENT 25 MCG/0.5ML IJ INJ
0.5000 mL | INJECTION | INTRAMUSCULAR | Status: AC
  Administered 2017-03-22: 0.5 mL via INTRAMUSCULAR
  Filled 2017-03-21: qty 0.5

## 2017-03-21 MED ORDER — HYDROCODONE-ACETAMINOPHEN 5-325 MG PO TABS: 1.0000 | ORAL_TABLET | ORAL | 0 refills | Status: DC | PRN

## 2017-03-21 MED ORDER — ONDANSETRON HCL 4 MG PO TABS: 4.0000 mg | ORAL_TABLET | Freq: Four times a day (QID) | ORAL | 0 refills | Status: AC | PRN

## 2017-03-21 MED ORDER — OXYCODONE HCL 5 MG PO TABS: 5.0000 mg | ORAL_TABLET | ORAL | 0 refills | Status: AC | PRN

## 2017-03-21 MED ORDER — ASPIRIN EC 325 MG PO TBEC: 325.0000 mg | DELAYED_RELEASE_TABLET | Freq: Two times a day (BID) | ORAL | 0 refills | Status: AC

## 2017-03-21 MED ORDER — DOCUSATE SODIUM 100 MG PO CAPS: 100.0000 mg | ORAL_CAPSULE | Freq: Two times a day (BID) | ORAL | 0 refills | Status: AC

## 2017-03-21 NOTE — Progress Notes (Signed)
Orthopedic Tech Progress Note Patient Details:  Ryan DykesJoshua Oconnell [birth year] [id number]  Ortho Devices Type of Ortho Device: Postop shoe/boot Ortho Device/Splint Location: lle Ortho Device/Splint Interventions: Application   Ryan Oconnell 03/21/2017, 12:30 PM

## 2017-03-21 NOTE — Progress Notes (Addendum)
MRI L foot reviewed. Patient does have lisfranc injury with small avulsion fx to base of 2nd MT. TMT alignment anatomic. Will have orthotech place SL posterior splint. NWB LLE, WBAT RLE. WIll need to f/u with foot & ankle specialist in 1 week for stress radiographs. Findings and plan communicated to patient over telephone.

## 2017-03-21 NOTE — Care Management (Signed)
Patient will be covered under worker's compensation. CM spoke with Mikey CollegeKelvin [phone],  who works with patient and he said he has just contacted worker's comp. CM will followup for information needed to obtain DME for patient.

## 2017-03-21 NOTE — Progress Notes (Signed)
Orthopedic Tech Progress Note Patient Details:  Ryan DykesJoshua Oconnell 1985/12/29 [id number]  Ortho Devices Type of Ortho Device: Ace wrap, Post (short leg) splint Ortho Device/Splint Location: LLE Ortho Device/Splint Interventions: Ordered, Application   Ryan Oconnell, Ryan Oconnell 03/21/2017, 6:54 PM

## 2017-03-21 NOTE — Evaluation (Signed)
Physical Therapy Evaluation Patient Details Name: Ryan Oconnell MRN: [id number] DOB: 13-Sep-1986 Today's Date: 03/21/2017   History of Present Illness  Ryan Oconnell was working when a stack of sheet metal fell onto him, knocking him backwards and striking his legs. One leg was behind the other and acted like a fulcrum causing the other to break. He c/o right lower leg pain and also left foot pain. Pt with open R tib/fib fx and underwent IM nail. Displaced 2nd and 3rd MT heads, conservative mgmt  Clinical Impression  Pt admitted with above diagnosis. Pt currently with functional limitations due to the deficits listed below (see PT Problem List). Pt very limited with mobility due to pain. Unable to ambulate but was able to transfer bed to chair to left with squat pivot and min A. Discharge complicated by job situation in that he lives 4 hrs away and travels site to site with his job. Thinks that he will currently stay in [place] but unsure how long and will not have assistance in the day. Will need w/c with bilateral elevating leg rests for primary mobility and RW for short distance ambulation.  Pt will benefit from skilled PT to increase their independence and safety with mobility to allow discharge to the venue listed below.       Follow Up Recommendations Home health PT    Equipment Recommendations  Wheelchair (measurements PT);Rolling walker with 5" wheels    Recommendations for Other Services OT consult     Precautions / Restrictions Precautions Precautions: None Required Braces or Orthoses: Other Brace/Splint Other Brace/Splint: post op shoe on left. Per Dr Linna Caprice, does not need anything on RLE Restrictions Weight Bearing Restrictions: Yes RLE Weight Bearing: Weight bearing as tolerated LLE Weight Bearing: Weight bearing as tolerated      Mobility  Bed Mobility Overal bed mobility: Needs Assistance Bed Mobility: Supine to Sit     Supine to sit: Min assist     General bed mobility  comments: min A to support RLE  Transfers Overall transfer level: Needs assistance Equipment used: Rolling walker (2 wheeled) Transfers: Sit to/from [employer] Sit to Stand: Min assist   Squat pivot transfers: Min assist     General transfer comment: pt stood to RW briefly but could not step or maintain standing due to pain. Squat pivot to left to recliner with min A for safety  Ambulation/Gait             General Gait Details: unable cuerrently due to pain  Stairs            Wheelchair Mobility    Modified Rankin (Stroke Patients Only)       Balance Overall balance assessment: No apparent balance deficits (not formally assessed)                                           Pertinent Vitals/Pain Pain Assessment: Faces Faces Pain Scale: Hurts whole lot Pain Location: R lower leg, L foot Pain Descriptors / Indicators: Aching;Shooting;Throbbing Pain Intervention(s): Limited activity within patient's tolerance;Monitored during session;Premedicated before session;Repositioned    Home Living Family/patient expects to be discharged to:: Private residence Living Arrangements: Non-relatives/Friends Available Help at Discharge: Friend(s);Available PRN/intermittently Type of Home: House Home Access: Stairs to enter Entrance Stairs-Rails: Right Entrance Stairs-Number of Steps: 3 Home Layout: One level Home Equipment: None      Prior Function Level  of Independence: Independent         Comments: pt is from [place] (4 hrs away), 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 He thinks that his boss is going to have him stay here in [place] at least for awhile. Will not have help while crew is working.      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 Lacking about 10 degrees extension due to pain. Able to extend 5 more degrees with quad set RLE: Unable to fully assess due to pain LLE Deficits / Details: dorsal foot very painful, could not tolerate full standing. Ankle mildly stiff. Hip and knee WFL LLE: Unable to fully assess due to pain    Cervical / Trunk Assessment Cervical / Trunk Assessment: Normal  Communication   Communication: No difficulties  Cognition Arousal/Alertness: Awake/alert Behavior During Therapy: WFL for tasks assessed/performed Overall Cognitive Status: Within Functional Limits for tasks assessed                                        General Comments General comments (skin integrity, edema, etc.): asked pt to have friends bring a R shoe. Will be w/c level until can better tolerate pressure through BLE's.     Exercises General Exercises - Lower Extremity Ankle Circles/Pumps: AROM;Both;10 reps;Supine Quad Sets: AROM;Right;10 reps;Seated   Assessment/Plan    PT Assessment Patient needs continued PT services  PT Problem List Decreased range of motion;Decreased activity tolerance;Decreased mobility;Decreased knowledge of use of DME;Pain       PT Treatment Interventions DME instruction;Gait training;Stair training;Functional mobility training;Therapeutic activities;Therapeutic exercise;Patient/family education;Wheelchair mobility training    PT Goals (Current goals can be found in the Care Plan section)  Acute Rehab PT Goals Patient Stated Goal: return to work PT Goal Formulation: With patient Time For Goal Achievement: 03/28/17 Potential to Achieve Goals: Good    Frequency Min 5X/week   Barriers to discharge Decreased caregiver support;Inaccessible home environment stairs to get into house, limited  assistance available    Co-evaluation               AM-PAC PT "6 Clicks" Daily Activity  Outcome Measure Difficulty turning over in bed (including adjusting bedclothes, sheets and blankets)?: A Little Difficulty moving from lying on back to sitting on the side of the bed? : A Little Difficulty sitting down on and standing up from a chair with arms (e.g., wheelchair, bedside commode, etc,.)?: Total Help needed moving to and from a bed to chair (including a wheelchair)?: A Little Help needed walking in hospital room?: Total Help needed climbing 3-5 steps with a railing? : Total 6 Click Score: 12    End of Session Equipment Utilized During Treatment: Gait belt Activity Tolerance: Patient limited by pain Patient left: in chair;with call bell/phone within reach Nurse Communication: Mobility status PT Visit Diagnosis: Pain;Other abnormalities of gait and mobility (R26.89) Pain - Right/Left: Right (and left) Pain - part of body: Leg;Ankle and joints of foot;Knee (R knee and leg, L foot)    Time: 1610-9604 PT Time Calculation (min) (ACUTE ONLY): 27 min  Charges:   PT Evaluation $PT Eval Moderate Complexity: 1 Procedure PT Treatments $Therapeutic Activity: 8-22 mins   PT G Codes:        Ryan Oconnell, PT  Acute Rehab Services  [phone]   Lawana ChambersVictoria L Sakira Oconnell 03/21/2017, 1:59 PM

## 2017-03-21 NOTE — ED Provider Notes (Signed)
Pt is a 31 y.o. male who presents with traumatic injury to his lower leg while at work.  Pt presented with severe deformity, angulation and open fracture of his right tib fib.  Physical Exam  Constitutional: No distress.  HENT:  Head: Normocephalic and atraumatic.  Eyes: Conjunctivae are normal. Left eye exhibits no discharge. No scleral icterus.  Neck: No tracheal deviation present. No thyromegaly present.  Pulmonary/Chest: Effort normal and breath sounds normal. No stridor.  Abdominal: He exhibits no distension.  Musculoskeletal: He exhibits tenderness and deformity.  Deformity, open fx mid shaft right tib fib  Neurological: He is alert.  Skin: Skin is warm. No rash noted. He is not diaphoretic. No erythema.  Psychiatric: Affect normal.     while in the ED the patient open tib-fib fracture was reduced to help decrease pain and promote circulation. Patient was splinted by the orthopedic tech. Neurovascular intact after the splint.   Orthopedics was consult and the patient was admitted to the hospital for further treatment   I saw and evaluated the patient, reviewed the resident's note and I agree with the findings and plan.    Linwood DibblesKnapp, Orvan Papadakis, MD 03/21/17 [phone]

## 2017-03-21 NOTE — Op Note (Signed)
NAME:  Ryan Oconnell, Ryan Oconnell NO.:  MEDICAL RECORD NO.:  [id number]  LOCATION:                                 FACILITY:  PHYSICIAN:  Ryan Oconnell     DATE OF BIRTH:  Oct 24, 1986  DATE OF PROCEDURE:  03/20/2017 DATE OF DISCHARGE:                              OPERATIVE REPORT   SURGEON:  Ryan Oconnell  ASSISTANT:  Ryan Coffin. [name], P.A.  PREOPERATIVE DIAGNOSES: 1. Grade 2 open right tibia fracture. 2. Left foot metatarsal neck fractures, 2 and 3.  PROCEDURES PERFORMED: 1. Excisional debridement of skin, subcutaneous tissue, muscle and     bone, right tibia. 2. Intramedullary fixation of right tibia.  IMPLANTS: 1. Biomet VersaNail tibial nail, 10 x 360 mm. 2. A 4.5-mm distal interlocking screws x2. 3. A 5.5-mm proximal interlocking screw x1.  ANESTHESIA:  General.  EBL:  200 mL.  ANTIBIOTICS:  1 g vancomycin.  COMPLICATIONS:  None.  TUBES AND DRAINS:  None.  SPECIMENS:  None.  DISPOSITION:  Stable to PACU.  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  He states that stack of sheet metal fell onto his legs.  He has complained of right lower leg pain, deformity, bleeding as well as left foot pain.  Radiographic workup revealed left metatarsal neck fractures, 2 and 3, and transverse right tibia- fibula shaft fractures. Risks, benefits and alternatives to surgical fixation were explained, he elected to proceed.  DESCRIPTION OF PROCEDURE IN DETAIL:  I identified the patient in the holding using two identifiers.  Surgical site was marked by myself.  He was taken to the operating room.  General anesthesia was then induced on his bed.  He was transferred to the operating room table.  He was positioned on bone foam.  Bump was placed under the hip.  Right lower extremity was prepped and draped in normal sterile surgical fashion. Time-out was called verifying the side and site of  surgery.  He did receive 1 g of vancomycin within 60 minutes at the beginning of the procedure.  I began by examining his lower extremity.  He had a 2-cm poke hole over the anteromedial aspect of the tibia.  The fracture was dislocated by exaggerating the deformity.  I used a #10 blade to extend his wound proximally and distally about a cm.  I used a #15 blade to excisionally debride nonviable skin edges, I used a rongeur to excisionally debride a small amount of necrotic subcutaneous fat as well as couple of excisionally debrided some loose bony fragments from the fracture site.  Total 3 L of sterile saline were irrigated through the fracture.  The fracture was reduced with traction.  I used fluoroscopy to define the patient's anatomy, a 4-cm incision was made just off the lateral border of the patella.  Dissection was carried down to the extensor mechanism.  I made a limited lateral parapatellar arthrotomy. I used digital blunt dissection to develop the plane between the patellar tendon and the fat pad.  I used the awl to determine the standard starting point for a tibial nail.  Guidepin was placed  under live AP and lateral fluoroscopic control.  Entry reamer was used.  I then placed the guidewire down to the physeal scar of the ankle.  The fracture was reduced nearly anatomically.  I sequentially reamed up to 11 mm with excellent chatter.  The real nail was selected and impacted into place.  I used perfect circle technique to place two transverse distal interlocking screws from medial to lateral.  I backslapped the fracture.  I then placed a single medial-to-lateral static transverse proximal screw.  The jig was removed.  Final AP and lateral fluoroscopy views were used to confirm fracture reduction, hardware placement. Fibular shaft alignment was acceptable.  He was noted to have a possible nondisplaced fibular head fracture.  I performed a thorough ligamentous exam of the knee  including anterior-posterior drawer, Lachman's, varus- valgus stress in full extension and 30 degrees of flexion.  Dial test normal.  There was no ligamentous laxity.  The wounds were then copiously irrigated with saline.  The arthrotomy was closed with #1 Vicryl.  The traumatic wound was closed with 2-0 interrupted nylon.  The surgical wounds were closed with 2-0 Vicryl for the deep dermal layer, staples for the skin. Sterile dressing was applied with Xeroform, 4x4s, ABDs, cast padding, Ace wrap.  The patient was then awakened from anesthesia, taken to PACU in stable condition.  Sponge, needle and instrument counts were correct at the end of the case x2.  There were no known complications.  Please note that, Alphonsa OverallBrad Dixon, PA-C was required throughout the surgery to maintain reduction of the fracture and allow anatomic placement of hardware.  Postoperatively, he may weightbear as tolerated on the right lower extremity.  We will obtain a postop shoe for the left foot, we will obtain an MRI of the left foot to rule out Lisfranc ligament sprain. The patient lives in [place] and he plans on following up with the local orthopedist after discharge.  We will perform neurovascular checks for the next 24 hours, start him on Lovenox for DVT prophylaxis.  He will have 24 hours of IV antibiotics, work with physical therapy.          ______________________________ Ryan FredericBrian Cyruss Arata, Oconnell     BS/MEDQ  D:  03/20/2017  T:  03/20/2017  Job:  [id number]

## 2017-03-21 NOTE — Progress Notes (Signed)
   Subjective:  Patient reports pain as mild to moderate.  No c/o.  Objective:   VITALS:   Vitals:   03/20/17 2100 03/20/17 2200 03/20/17 2300 03/21/17 0500  BP: (!) 161/91 140/70 (!) 146/79 122/77  Pulse: 82 65  86  Resp: 16 16 16 18   Temp: 98 F (36.7 C)   99.2 F (37.3 C)  TempSrc: Oral   Oral  SpO2: 99% 94% 99% 100%  Weight:    77.1 kg (170 lb)  Height:    6' (1.829 m)    NAD ABD soft Sensation intact distally Intact pulses distally Dorsiflexion/Plantar flexion intact Incision: dressing C/D/I Compartment soft No pain with passive stretch  Lab Results  Component Value Date   WBC 12.5 (H) 03/20/2017   HGB 14.0 03/20/2017   HCT 41.6 03/20/2017   MCV 92.0 03/20/2017   PLT 215 03/20/2017   BMET    Component Value Date/Time   NA 139 03/20/2017 1543   K 4.0 03/20/2017 1543   CL 106 03/20/2017 1543   CO2 24 03/20/2017 1543   GLUCOSE 97 03/20/2017 1543   BUN 15 03/20/2017 1543   CREATININE 1.11 03/20/2017 1543   CALCIUM 9.6 03/20/2017 1543   GFRNONAA >60 03/20/2017 1543   GFRAA >60 03/20/2017 1543     Assessment/Plan: 1 Day Post-Op   Active Problems:   Open right tibial fracture   Open displaced transverse fracture of shaft of right tibia   A/P: 1. Grade II open right tibia fx s/p I&D, IM nail 2. L MT neck 2-3 fxs  -WBAT RLE with walker -24 hours IV abx for open fracture -postop shoe L foot -MRI L foot pending to r/o lisfranc ligament injury -DVT ppx: lovenox in house --> home on ASA -PT/OT -Dispo: likely D/C home tomorrow    Garnet KoyanagiSwinteck, Alayshia Marini James 03/21/2017, 8:21 AM   Samson FredericBrian Makinze Jani, MD Cell [phone]

## 2017-03-22 ENCOUNTER — Encounter: Attending: Internal Medicine | Primary: Nurse Practitioner

## 2017-03-22 NOTE — Progress Notes (Signed)
Physical Therapy Treatment Patient Details Name: Ryan DykesJoshua Oconnell MRN: [id number] DOB: [birth year] Today's Date: 03/22/2017    History of Present Illness [name] was working when a stack of sheet metal fell onto him, knocking him backwards and striking his legs. One leg was behind the other and acted like a fulcrum causing the other to break. He c/o right lower leg pain and also left foot pain. Pt with open R tib/fib fx and underwent IM nail. Displaced 2nd and 3rd MT heads, conservative mgmt    PT Comments    Pt performed increased activity performing transfer to chair and reclined/supine exercises.  Increased pain with activity but remains tolerable.  New MRI reveals injury to L foot beyond fracture.  Pt is now NWB on LLE and splinted.     Follow Up Recommendations  Home health PT     Equipment Recommendations  Wheelchair (measurements PT);Rolling walker with 5" wheels    Recommendations for Other Services OT consult     Precautions / Restrictions Precautions Precautions: None Required Braces or Orthoses: Other Brace/Splint Other Brace/Splint: Pt in L split due to ligament injury.  Do not use post-op shoe anymore.   Restrictions Weight Bearing Restrictions: Yes RLE Weight Bearing: Weight bearing as tolerated LLE Weight Bearing: Non weight bearing    Mobility  Bed Mobility Overal bed mobility: Needs Assistance Bed Mobility: Supine to Sit     Supine to sit: Min assist     General bed mobility comments: min A to support RLE.  Pt with good upper trunk control and use of UEs.    Transfers Overall transfer level: Needs assistance Equipment used: None Transfers: Licensed conveyancerAnterior-Posterior Transfer       Anterior-Posterior transfers: Min assist   General transfer comment: Pt unable to stand due to pain in R Leg and new WBS on LLE (NWB).  Pt performed posterior scoot from bed to recliner chair.    Ambulation/Gait Ambulation/Gait assistance:  (unable at this time.  )                Stairs            Wheelchair Mobility    Modified Rankin (Stroke Patients Only)       Balance Overall balance assessment: Needs assistance   Sitting balance-Leahy Scale: Normal       Standing balance-Leahy Scale: Zero                              Cognition Arousal/Alertness: Awake/alert Behavior During Therapy: WFL for tasks assessed/performed Overall Cognitive Status: Within Functional Limits for tasks assessed                                        Exercises General Exercises - Lower Extremity Ankle Circles/Pumps: AROM;Right;10 reps;Supine Quad Sets: AROM;Both;10 reps;Supine Gluteal Sets: AROM;Both;10 reps;Supine Heel Slides: AROM;Both;10 reps;Supine (limited ROM on R side. ) Hip ABduction/ADduction: AROM;Both;10 reps;Supine    General Comments        Pertinent Vitals/Pain Pain Assessment: 0-10 Pain Score: 7  Pain Descriptors / Indicators: Aching;Shooting;Throbbing Pain Intervention(s): Monitored during session;Repositioned    Home Living                      Prior Function            PT Goals (current goals can now be found  in the care plan section) Acute Rehab PT Goals Patient Stated Goal: return to work Potential to Achieve Goals: Good Progress towards PT goals: Progressing toward goals    Frequency    Min 5X/week      PT Plan      Co-evaluation              AM-PAC PT "6 Clicks" Daily Activity  Outcome Measure  Difficulty turning over in bed (including adjusting bedclothes, sheets and blankets)?: A Little Difficulty moving from lying on back to sitting on the side of the bed? : A Little Difficulty sitting down on and standing up from a chair with arms (e.g., wheelchair, bedside commode, etc,.)?: Total Help needed moving to and from a bed to chair (including a wheelchair)?: A Little Help needed walking in hospital room?: Total Help needed climbing 3-5 steps with a railing? :  Total 6 Click Score: 12    End of Session Equipment Utilized During Treatment: Gait belt Activity Tolerance: Patient limited by pain Patient left: in chair;with call bell/phone within reach Nurse Communication: Mobility status PT Visit Diagnosis: Pain;Other abnormalities of gait and mobility (R26.89) Pain - Right/Left: Right (and L) Pain - part of body: Leg;Ankle and joints of foot;Knee (R knee and leg, L foot)     Time: 1610-9604 PT Time Calculation (min) (ACUTE ONLY): 26 min  Charges:  $Therapeutic Exercise: 8-22 mins $Therapeutic Activity: 8-22 mins                    G Codes:       Joycelyn Rua, PTA pager [phone]    Florestine Avers 03/22/2017, 4:57 PM

## 2017-03-22 NOTE — Care Management Note (Addendum)
Case Management Note  Patient Details  Name: Ryan DykesJoshua Oconnell MRN: [id number] Date of Birth: 06/24/1986  Subjective/Objective:     31 yr old gentleman s/p work accident resulting in right tibia fracture and left foot fractures of metatarsals 2 and 3. Patient underwent a right tib/fib IM Nailing.                Action/Plan:   Case manager continues to work at obtaining worker's compensation information. Multiple calls have been placed to [employer], spoke with Alco[place]Jean, [phone]. CM has explained information needed. Carney BernJean says she is waiting for an adjuster and claim number to be assigned. CM spoke with patient concerning discharge plan and needs. Patient informed CM that he is not able to return to [place] at this time and he will stay at the house they have provided in [place]. Address is [address] [place], [place] [address]. His cell# is [phone]. Transportation will have to be arranged. CM notified Dr. Linna CapriceSwinteck of progress, lack of, with worker's comp. Patient will have to follow up with Dr. Linna CapriceSwinteck  On Wednesday 03/28/17. CM has contacted Dr. Kathline MagicSwinteck's office to arrange appt.       Expected Discharge Date:     pending             Expected Discharge Plan:  Home w Home Health Services  In-House Referral:     Discharge planning Services  CM Consult  Post Acute Care Choice:  Durable Medical Equipment, Home Health Choice offered to:   (patient under worker's comp. they will arrange DME )  DME Arranged:  3-N-1, Walker rolling, Wheelchair manual DME Agency:   (worker's comp to arrange)  HH Arranged:  PT, Nurse's Aide HH Agency:   (worker's comp to arrange)  Status of Service:  In process, will continue to follow  If discussed at Long Length of Stay Meetings, dates discussed:    Additional Comments:  3:59pm Adjuster for patient called, Jonni SangerZelda Hill, [employer]  [phone] Fax [phone]. Claim # is [id number].  Patient's employer is [employer], [address] [place], [place] [id number]. Zelda has to assign a nurse case manager to patient's case to get home health and DME needs arranged. Patient will also need prescription coverage. Per his adjuster, Zelda, patient likely will not be setup before Monday. CM will notify Dr. Linna CapriceSwinteck of same. [facility] and [facility] to [facility].    Durenda GuthrieBrady, [name], RN 03/22/2017, 3:56 PM

## 2017-03-22 NOTE — Discharge Instructions (Signed)
Dr. Samson FredericBrian Palmira Stickle Adult Hip [facility] Knee Specialist [facility] [address] [place], [place] [phone]   POSTOPERATIVE DIRECTIONS    Rehabilitation, Guidelines Following Surgery   WEIGHT BEARING Weight bearing as tolerated with assist device (walker, cane, etc) as directed, use it as long as suggested by your surgeon or therapist, typically at least 4-6 weeks.   HOME CARE INSTRUCTIONS  Remove items at home which could result in a fall. This includes throw rugs or furniture in walking pathways.  Continue medications as instructed at time of discharge.  You may have some home medications which will be placed on hold until you complete the course of blood thinner medication.  4 days after discharge, you may start showering. No tub baths or soaking your incisions. Do not put on socks or shoes without following the instructions of your caregivers.   Sit on chairs with arms. Use the chair arms to help push yourself up when arising.  Arrange for the use of a toilet seat elevator so you are not sitting low.   Walk with walker as instructed.  You may resume a sexual relationship in one month or when given the OK by your caregiver.  Use walker as long as suggested by your caregivers.  Avoid periods of inactivity such as sitting longer than an hour when not asleep. This helps prevent blood clots.  You may return to work once you are cleared by Designer, industrial/productyour surgeon.  Do not drive a car for 6 weeks or until released by your surgeon.  Do not drive while taking narcotics.  Wear elastic stockings for two weeks following surgery during the day but you may remove then at night.  Make sure you keep all of your appointments after your operation with all of your doctors and caregivers. You should call the office at the above phone number and make an appointment for approximately two weeks after the date of your surgery. Please pick up a stool softener and laxative for  home use as long as you are requiring pain medications.  ICE to the affected hip every three hours for 30 minutes at a time and then as needed for pain and swelling. Continue to use ice on the hip for pain and swelling from surgery. You may notice swelling that will progress down to the foot and ankle.  This is normal after surgery.  Elevate the leg when you are not up walking on it.   It is important for you to complete the blood thinner medication as prescribed by your doctor.  Continue to use the breathing machine which will help keep your temperature down.  It is common for your temperature to cycle up and down following surgery, especially at night when you are not up moving around and exerting yourself.  The breathing machine keeps your lungs expanded and your temperature down.  RANGE OF MOTION AND STRENGTHENING EXERCISES  These exercises are designed to help you keep full movement of your hip joint. Follow your caregiver's or physical therapist's instructions. Perform all exercises about fifteen times, three times per day or as directed. Exercise both hips, even if you have had only one joint replacement. These exercises can be done on a training (exercise) mat, on the floor, on a table or on a bed. Use whatever works the best and is most comfortable for you. Use music or television while you are exercising so that the exercises are a pleasant break in your day. This will make  your life better with the exercises acting as a break in routine you can look forward to.  Lying on your back, slowly slide your foot toward your buttocks, raising your knee up off the floor. Then slowly slide your foot back down until your leg is straight again.  Lying on your back spread your legs as far apart as you can without causing discomfort.  Lying on your side, raise your upper leg and foot straight up from the floor as far as is comfortable. Slowly lower the leg and repeat.  Lying on your back, tighten up the  muscle in the front of your thigh (quadriceps muscles). You can do this by keeping your leg straight and trying to raise your heel off the floor. This helps strengthen the largest muscle supporting your knee.  Lying on your back, tighten up the muscles of your buttocks both with the legs straight and with the knee bent at a comfortable angle while keeping your heel on the floor.   SKILLED REHAB INSTRUCTIONS: If the patient is transferred to a skilled rehab facility following release from the hospital, a list of the current medications will be sent to the facility for the patient to continue.  When discharged from the skilled rehab facility, please have the facility set up the patient's Home Health Physical Therapy prior to being released. Also, the skilled facility will be responsible for providing the patient with their medications at time of release from the facility to include their pain medication and their blood thinner medication. If the patient is still at the rehab facility at time of the two week follow up appointment, the skilled rehab facility will also need to assist the patient in arranging follow up appointment in our office and any transportation needs.  MAKE SURE YOU:  Understand these instructions.  Will watch your condition.  Will get help right away if you are not doing well or get worse.  Pick up stool softner and laxative for home use following surgery while on pain medications. Daily dry dressing changes as needed. In 4 days, you may remove your dressings and begin taking showers - no tub baths or soaking the incisions. Continue to use ice for pain and swelling after surgery. Do not use any lotions or creams on the incision until instructed by your surgeon. Weight bearing as tolerated right leg. Non weight bearing left leg - do not remove splint. Follow up with a local orthopaedic foot and ankle specialist 1 week after discharge.

## 2017-03-22 NOTE — Care Management (Signed)
Case manager continues to work at obtaining worker's compensation information. Multiple calls have been placed to [employer], spoke with [place]Jean, [phone]. CM has explained information needed. Carney BernJean says she is waiting for an adjuster and claim number to be assigned. CM spoke with patient concerning discharge plan and needs. Patient informed CM that he is not able to return to [place] at this time and he will stay at the house they have provided in [place]. Address is [address] [place], [place] [address]. His cell# is [phone]. Transportation will have to be arranged. CM notified Dr. Linna Oconnell of progress, lack of, with worker's comp. Patient will have to follow up with Dr. Linna Oconnell  On Wednesday 03/28/17. CM has contacted Dr. Kathline Oconnell's office to arrange appt.

## 2017-03-22 NOTE — Progress Notes (Signed)
   Subjective:  Patient reports pain as mild to moderate.  No c/o.  Objective:   VITALS:   Vitals:   03/21/17 0500 03/21/17 1530 03/21/17 2046 03/22/17 0620  BP: 122/77 132/67 (!) 143/83 134/71  Pulse: 86 69 86 70  Resp: 18 17 16 16   Temp: 99.2 F (37.3 C) 98.7 F (37.1 C) 99.5 F (37.5 C) 98.4 F (36.9 C)  TempSrc: Oral Oral Oral Oral  SpO2: 100% 98% 100% 99%  Weight: 77.1 kg (170 lb)     Height: 6' (1.829 m)       NAD ABD soft Sensation intact distally Intact pulses distally Dorsiflexion/Plantar flexion intact Incision: dressing C/D/I Compartment soft No pain with passive stretch  Lab Results  Component Value Date   WBC 12.5 (H) 03/20/2017   HGB 14.0 03/20/2017   HCT 41.6 03/20/2017   MCV 92.0 03/20/2017   PLT 215 03/20/2017   BMET    Component Value Date/Time   NA 139 03/20/2017 1543   K 4.0 03/20/2017 1543   CL 106 03/20/2017 1543   CO2 24 03/20/2017 1543   GLUCOSE 97 03/20/2017 1543   BUN 15 03/20/2017 1543   CREATININE 1.11 03/20/2017 1543   CALCIUM 9.6 03/20/2017 1543   GFRNONAA >60 03/20/2017 1543   GFRAA >60 03/20/2017 1543     Assessment/Plan: 2 Days Post-Op   Active Problems:   Open right tibial fracture   Open displaced transverse fracture of shaft of right tibia   A/P: 1. Grade II open right tibia fx s/p I&D, IM nail 2. L lisfranc ligament injury, MT neck 2-3 fxs  -WBAT RLE with walker -NWB LLE in splint -DVT ppx: lovenox in house --> home on ASA -PT/OT -Dispo: D/C home after clears therapy, [facility]    Grete Bosko, Cloyde ReamsBrian James 03/22/2017, 7:48 AM   Samson FredericBrian Shilah Hefel, MD Cell [phone]

## 2017-03-23 NOTE — Care Management (Signed)
Case manager received call from Claudean KindsSandra Showalter, RN BSN CM with [employer], [phone], her fax is [phone]. CM provided her with [facility] to [facility] for [facility] and HHPT/NA. Orders and H&P, demographics, op note faxed to her. Dois DavenportSandra is going to contact [facility] to provide authorization for medications to be filled there : [address], [place], [place] [phone]. Worker's Comp will cover ambulance transport home for patient on Sunday, Mar 25, 2017. This is when the person that will assist him will be in [place].  CM has arranged followup appointment for patient with Dr. Linna CapriceSwinteck on Wednesday, 5/16/at 8:15am. This information has been entered on discharge paperwork.

## 2017-03-23 NOTE — Progress Notes (Signed)
Physical Therapy Treatment Patient Details Name: Ryan DykesJoshua Oconnell MRN: [id number] DOB: 06/27/1986 Today's Date: 03/23/2017    History of Present Illness Ryan Oconnell was working when a stack of sheet metal fell onto him, knocking him backwards and striking his legs. One leg was behind the other and acted like a fulcrum causing the other to break. He c/o right lower leg pain and also left foot pain. Pt with open R tib/fib fx and underwent IM nail. Displaced 2nd and 3rd MT heads, conservative mgmt. MRI revealed other fractures in L foot, therefore, L foot is now NWB and in splint.     PT Comments    Pt progressing towards goals. Practiced transfer into WC and WC mobility this session. Pt required supervision for safety and verbal cues for technique. Educated about components of WC and how to use at home. Educated about need for ramp, and pt stated his coworkers could build one. Pt reported concerns about bathing and transfers into bath tub; OT consult ordered. Will continue to follow and progress mobility according to pt tolerance.    Follow Up Recommendations  Home health PT     Equipment Recommendations  Wheelchair (measurements PT);Rolling walker with 5" wheels    Recommendations for Other Services OT consult     Precautions / Restrictions Precautions Precautions: None Restrictions Weight Bearing Restrictions: Yes RLE Weight Bearing: Weight bearing as tolerated LLE Weight Bearing: Non weight bearing    Mobility  Bed Mobility Overal bed mobility: Needs Assistance Bed Mobility: Supine to Sit     Supine to sit: Min assist     General bed mobility comments: Min A to support RLE during bed mobility, but demonstrates good trunk control.   Transfers Overall transfer level: Needs assistance Equipment used: None Transfers: Licensed conveyancerAnterior-Posterior Transfer       Anterior-Posterior transfers: Min assist   General transfer comment: Pt continues to be unable to stand on RLE secondary to  pain and requested to perform anterior/posterior transfer. Supervision for safety. Pt demonstrating good technique to perform transfer [facility]. Verbal cues to maintain LLE NWB. Min A for RLE support.   Ambulation/Gait                 Administratortairs            Wheelchair Mobility Wheelchair Mobility Wheelchair mobility: Yes Wheelchair propulsion: Both upper extremities Wheelchair parts: Supervision/cueing Distance: 200 Wheelchair Assistance Details (indicate cue type and reason): Cues for UE sequencing for propulsion. Verbal cues for how to propel WC around obstacles. Education regarding WC components and important of brakes before transferring. Pt required cues to apply breaks before transfer back to bed.   Modified Rankin (Stroke Patients Only)       Balance Overall balance assessment: Needs assistance Sitting-balance support: No upper extremity supported;Feet supported Sitting balance-Leahy Scale: Normal                                      Cognition Arousal/Alertness: Awake/alert Behavior During Therapy: WFL for tasks assessed/performed Overall Cognitive Status: Within Functional Limits for tasks assessed                                        Exercises      General Comments General comments (skin integrity, edema, etc.): Educated  about need for ramp once pt returns home. Reported his coworkers could easily build ramp. Pt voiced concern about transfers into shower. Educated about purpose of OT and OT consult placed.       Pertinent Vitals/Pain Pain Assessment: Faces Faces Pain Scale: Hurts even more Pain Location: R lower leg, L foot Pain Descriptors / Indicators: Aching;Shooting;Throbbing Pain Intervention(s): Limited activity within patient's tolerance;Monitored during session;Repositioned    Home Living                      Prior Function            PT Goals (current goals can now be  found in the care plan section) Acute Rehab PT Goals Patient Stated Goal: return to work PT Goal Formulation: With patient Time For Goal Achievement: 03/28/17 Potential to Achieve Goals: Good Progress towards PT goals: Progressing toward goals    Frequency    Min 5X/week      PT Plan Current plan remains appropriate    Co-evaluation              AM-PAC PT "6 Clicks" Daily Activity  Outcome Measure  Difficulty turning over in bed (including adjusting bedclothes, sheets and blankets)?: A Little Difficulty moving from lying on back to sitting on the side of the bed? : A Little Difficulty sitting down on and standing up from a chair with arms (e.g., wheelchair, bedside commode, etc,.)?: Total Help needed moving to and from a bed to chair (including a wheelchair)?: A Little Help needed walking in hospital room?: Total Help needed climbing 3-5 steps with a railing? : Total 6 Click Score: 12    End of Session         PT Visit Diagnosis: Pain;Other abnormalities of gait and mobility (R26.89) Pain - Right/Left:  (bilateral) Pain - part of body: Leg;Ankle and joints of foot     Time: 9604-5409 PT Time Calculation (min) (ACUTE ONLY): 33 min  Charges:  $Therapeutic Activity: 8-22 mins $Wheel Chair Management: 8-22 mins                    G Codes:       Margot Chimes, PT, DPT  Acute Rehabilitation Services  Pager: [phone]    Melvyn Novas 03/23/2017, 1:55 PM

## 2017-03-23 NOTE — Progress Notes (Signed)
   Subjective:  Patient reports pain as mild to moderate.  No c/o.  Objective:   VITALS:   Vitals:   03/22/17 0620 03/22/17 1501 03/22/17 2030 03/23/17 0520  BP: 134/71 (!) 142/81 (!) 153/72 (!) 151/77  Pulse: 70 78 73 82  Resp: 16 17 16 17   Temp: 98.4 F (36.9 C) 98.6 F (37 C) 98.9 F (37.2 C) 100.1 F (37.8 C)  TempSrc: Oral Oral Oral Oral  SpO2: 99% 98% 100% 100%  Weight:      Height:        NAD ABD soft Sensation intact distally Intact pulses distally Dorsiflexion/Plantar flexion intact Incision: dressing C/D/I Compartment soft No pain with passive stretch  Lab Results  Component Value Date   WBC 12.5 (H) 03/20/2017   HGB 14.0 03/20/2017   HCT 41.6 03/20/2017   MCV 92.0 03/20/2017   PLT 215 03/20/2017   BMET    Component Value Date/Time   NA 139 03/20/2017 1543   K 4.0 03/20/2017 1543   CL 106 03/20/2017 1543   CO2 24 03/20/2017 1543   GLUCOSE 97 03/20/2017 1543   BUN 15 03/20/2017 1543   CREATININE 1.11 03/20/2017 1543   CALCIUM 9.6 03/20/2017 1543   GFRNONAA >60 03/20/2017 1543   GFRAA >60 03/20/2017 1543     Assessment/Plan: 3 Days Post-Op   Active Problems:   Open right tibial fracture   Open displaced transverse fracture of shaft of right tibia   A/P: 1. Grade II open right tibia fx s/p I&D, IM nail 2. L lisfranc ligament injury, MT neck 2-3 fxs  -WBAT RLE with walker -NWB LLE in splint -DVT ppx: lovenox in house --> home on ASA -PT/OT -Dispo: plans to stay in [place], [facility], f/u in the office this Wed with me    Joslyne Marshburn, Cloyde ReamsBrian James 03/23/2017, 10:27 AM   Samson FredericBrian Timberlyn Pickford, MD Cell [phone]

## 2017-03-23 NOTE — Progress Notes (Signed)
Patient requesting nicotine patch.  Message left with Dr. Jerl MinaSwine's office.

## 2017-03-24 DIAGNOSIS — S93622A Sprain of tarsometatarsal ligament of left foot, initial encounter: Secondary | ICD-10-CM

## 2017-03-24 DIAGNOSIS — S92302A Fracture of unspecified metatarsal bone(s), left foot, initial encounter for closed fracture: Secondary | ICD-10-CM

## 2017-03-24 NOTE — Discharge Summary (Signed)
Physician Discharge Summary  Patient ID: Ryan Oconnell MRN: [id number] DOB/AGE: December 14, 1985 31 y.o.  Admit date: 03/20/2017 Discharge date: 03/26/2017  Admission Diagnoses:  Open right tibial fracture  Discharge Diagnoses:  Principal Problem:   Open right tibial fracture Active Problems:   Open displaced transverse fracture of shaft of right tibia   Lisfranc's sprain, left, initial encounter   Closed fracture of metatarsal neck, left, initial encounter   History reviewed. No pertinent past medical history.  Surgeries: Procedure(s): INTRAMEDULLARY (IM) NAIL TIBIAL IRRIGATION AND DEBRIDEMENT RIGHT TIBIA on 03/20/2017   Consultants (if any):   Discharged Condition: Improved  Hospital Course: Ryan Oconnell is an 31 y.o. male who was admitted 03/20/2017 with a diagnosis of Open right tibial fracture and went to the operating room on 03/20/2017 and underwent the above named procedures.    He was given perioperative antibiotics:  Anti-infectives    Start     Dose/Rate Route Frequency Ordered Stop   03/21/17 0000  clindamycin (CLEOCIN) IVPB 600 mg     600 mg 100 mL/hr over 30 Minutes Intravenous Every 6 hours 03/20/17 2142 03/21/17 2055   03/20/17 2300  vancomycin (VANCOCIN) IVPB 1000 mg/200 mL premix     1,000 mg 200 mL/hr over 60 Minutes Intravenous Every 12 hours 03/20/17 2142 03/21/17 0230   03/20/17 2215  vancomycin (VANCOCIN) IVPB 1000 mg/200 mL premix     1,000 mg 200 mL/hr over 60 Minutes Intravenous On call to O.R. 03/20/17 2142 03/21/17 0559   03/20/17 1545  clindamycin (CLEOCIN) IVPB 600 mg     600 mg 100 mL/hr over 30 Minutes Intravenous  Once 03/20/17 1540 03/20/17 1647    .  MRI of the left foot show lisfranc ligament injury, and he was made non weight bearing in a splint. Needs stress xrays next week to evaluate stability.  He was given sequential compression devices, early ambulation, and lovenox for DVT prophylaxis.  He benefited maximally from the hospital  stay and there were no complications.    Recent vital signs:  Vitals:   03/25/17 1807 03/26/17 0644  BP: (!) 148/84 139/80  Pulse: (!) 108 (!) 110  Resp: 18 16  Temp: (!) 100.4 F (38 C) 99.3 F (37.4 C)    Recent laboratory studies:  Lab Results  Component Value Date   HGB 14.0 03/20/2017   Lab Results  Component Value Date   WBC 12.5 (H) 03/20/2017   PLT 215 03/20/2017   No results found for: INR Lab Results  Component Value Date   NA 139 03/20/2017   K 4.0 03/20/2017   CL 106 03/20/2017   CO2 24 03/20/2017   BUN 15 03/20/2017   CREATININE 1.11 03/20/2017   GLUCOSE 97 03/20/2017    Discharge Medications:   Allergies as of 03/26/2017      Reactions   Penicillins Swelling   Has patient had a PCN reaction causing immediate rash, facial/tongue/throat swelling, SOB or lightheadedness with hypotension: Yes Has patient had a PCN reaction causing severe rash involving mucus membranes or skin necrosis: Yes Has patient had a PCN reaction that required hospitalization: Yes Has patient had a PCN reaction occurring within the last 10 years: No If all of the above answers are "NO", then may proceed with Cephalosporin use.      Medication List    STOP taking these medications   acetaminophen 500 MG tablet Commonly known as:  TYLENOL   ibuprofen 200 MG tablet Commonly known as:  ADVIL,MOTRIN  TAKE these medications   aspirin EC 325 MG tablet Take 1 tablet (325 mg total) by mouth 2 (two) times daily after a meal.   docusate sodium 100 MG capsule Commonly known as:  COLACE Take 1 capsule (100 mg total) by mouth 2 (two) times daily.   ondansetron 4 MG tablet Commonly known as:  ZOFRAN Take 1 tablet (4 mg total) by mouth every 6 (six) hours as needed for nausea.   oxyCODONE 5 MG immediate release tablet Commonly known as:  Oxy IR/ROXICODONE Take 1-3 tablets (5-15 mg total) by mouth every 4 (four) hours as needed (5mg  for mild pain, 10mg  for moderate pain, 15mg   for severe pain).            Durable Medical Equipment        Start     Ordered   03/21/17 1504  For home use only DME standard manual wheelchair with seat cushion  Once    Comments:  Patient suffers from right tibia/fib fracture, left foot fractures, which impairs their ability to perform daily activities like ambulating in the home.  A cane will not resolve  issue with performing activities of daily living. A wheelchair will allow patient to safely perform daily activities. Patient can safely propel the wheelchair in the home or has a caregiver who can provide assistance.  Accessories: elevating leg rests (ELRs), wheel locks, extensions and anti-tippers.   03/21/17 1508   03/20/17 2143  DME Walker rolling  Once    Question:  Patient needs a walker to treat with the following condition  Answer:  Open displaced transverse fracture of shaft of right tibia   03/20/17 2142   03/20/17 2143  DME 3 n 1  Once     03/20/17 2142      Diagnostic Studies: Dg Tibia/fibula Left  Result Date: 03/20/2017 CLINICAL DATA:  Heavy beam fell on left leg. EXAM: LEFT TIBIA AND FIBULA - 2 VIEW COMPARISON:  None. FINDINGS: The knee and ankle joints are maintained. No fractures of the tibia or fibula are identified. IMPRESSION: No acute fracture. Electronically Signed   By: Rudie Meyer M.D.   On: 03/20/2017 16:22   Dg Tibia/fibula Right  Result Date: 03/20/2017 CLINICAL DATA:  Right tibial intramedullary nail EXAM: DG C-ARM 61-120 MIN; RIGHT TIBIA AND FIBULA - 2 VIEW COMPARISON:  Right leg radiograph 03/20/2017 FINDINGS: Six fluoroscopic images with a reported fluoroscopy time of 1 minutes 41 seconds show placement of an antegrade intramedullary nail with proximal and distal interlocking screws traversing the site of a midshaft tibial fracture and anatomic alignment. IMPRESSION: Intraoperative fluoroscopy during placement of right tibial intramedullary nail. Electronically Signed   By: Deatra Robinson M.D.   On:  03/20/2017 20:33   Dg Tibia/fibula Right  Result Date: 03/20/2017 CLINICAL DATA:  Heavy beam fell on patient's right leg. EXAM: RIGHT TIBIA AND FIBULA - 2 VIEW COMPARISON:  None. FINDINGS: There are transverse fractures through the midshaft regions of both the tibia and fibula. Medial angulation of both fractures. There is also slight dorsal angulation. The knee and ankle joints are maintained. IMPRESSION: Midshaft tibia and fibular fractures. Electronically Signed   By: Rudie Meyer M.D.   On: 03/20/2017 16:21   Mr Foot Left Wo Contrast  Result Date: 03/21/2017 CLINICAL DATA:  Distraction accident.  Left foot pain. EXAM: MRI OF THE LEFT FOOT WITHOUT CONTRAST TECHNIQUE: Multiplanar, multisequence MR imaging of the left forefoot was performed. No intravenous contrast was administered. COMPARISON:  None. FINDINGS:  Bones/Joint/Cartilage Bone marrow edema in the second and third metatarsal necks with a subtle linear component consistent with nondisplaced fractures. Nondisplaced fracture along the plantar aspect of the second metatarsal base with surrounding marrow edema. Mild marrow edema with possible nondisplaced fracture of the dorsal aspect of the lateral cuneiform. Possible nondisplaced fracture of the medial base of the third metatarsal. Mild marrow edema in the proximal shaft of the fifth metatarsal. Normal alignment. No joint effusion. Ligaments Collateral ligaments are intact. Complete tear of the Lisfranc ligament. Anterior and posterior talofibular ligaments are intact. Deltoid ligament is intact. Muscles and Tendons Flexor, peroneal and extensor compartment tendons are intact. Muscles are normal. Soft tissue No fluid collection or hematoma.  No soft tissue mass. IMPRESSION: 1.  Complete tear of the Lisfranc ligament. 2. Nondisplaced fracture along the plantar aspect of the second metatarsal base with surrounding marrow edema. 3. Mild marrow edema with possible nondisplaced fracture of the dorsal  aspect of the lateral cuneiform. Possible nondisplaced fracture of the medial base of the third metatarsal. 4. Nondisplaced fractures of the second and third metatarsal necks. Electronically Signed   By: Elige KoHetal  Patel   On: 03/21/2017 09:13   Dg Tibia/fibula Right Port  Result Date: 03/20/2017 CLINICAL DATA:  Postoperative evaluation for fractures EXAM: PORTABLE RIGHT TIBIA AND FIBULA - 2 VIEW COMPARISON:  Studies obtained earlier in the day FINDINGS: Frontal and lateral views were obtained. There is screw and rod fixation through a fracture all the junction of the mid and distal thirds of the tibia with alignment anatomic at the fracture site. There remains a fracture of the junction of mid and distal thirds of the fibula with lateral displacement of the distal fracture fragment with respect to the proximal fragment. No dislocation. No new fractures. Joint spaces appear intact. IMPRESSION: Postoperative fixation of fracture at the junction of mid and distal thirds of the tibia with alignment anatomic. Fracture junction of mid and distal thirds of the fibula with lateral displacement distally. No dislocation. No appreciable arthropathic change. Electronically Signed   By: Bretta BangWilliam  Woodruff III M.D.   On: 03/20/2017 20:30   Dg Foot Complete Left  Result Date: 03/20/2017 CLINICAL DATA:  Heavy beam fell on patient's foot. EXAM: LEFT FOOT - COMPLETE 3+ VIEW COMPARISON:  None. FINDINGS: There are fractures involving the second and third metatarsal necks. The joint spaces are maintained. No other fractures are identified. IMPRESSION: Mildly displaced fractures of the second and third metatarsal necks. Electronically Signed   By: Rudie MeyerP.  Gallerani M.D.   On: 03/20/2017 16:19   Dg C-arm 1-60 Min  Result Date: 03/20/2017 CLINICAL DATA:  Right tibial intramedullary nail EXAM: DG C-ARM 61-120 MIN; RIGHT TIBIA AND FIBULA - 2 VIEW COMPARISON:  Right leg radiograph 03/20/2017 FINDINGS: Six fluoroscopic images with a reported  fluoroscopy time of 1 minutes 41 seconds show placement of an antegrade intramedullary nail with proximal and distal interlocking screws traversing the site of a midshaft tibial fracture and anatomic alignment. IMPRESSION: Intraoperative fluoroscopy during placement of right tibial intramedullary nail. Electronically Signed   By: Deatra RobinsonKevin  Herman M.D.   On: 03/20/2017 20:33    Disposition: Final discharge disposition not confirmed  Discharge Instructions    Call MD / Call 911    Complete by:  As directed    If you experience chest pain or shortness of breath, CALL 911 and be transported to the hospital emergency room.  If you develope a fever above 101 F, pus (white drainage) or increased drainage  or redness at the wound, or calf pain, call your surgeon's office.   Constipation Prevention    Complete by:  As directed    Drink plenty of fluids.  Prune juice may be helpful.  You may use a stool softener, such as Colace (over the counter) 100 mg twice a day.  Use MiraLax (over the counter) for constipation as needed.   Diet - low sodium heart healthy    Complete by:  As directed    Discharge instructions    Complete by:  As directed    Weight bearing as tolerated right leg Non weight bearing left leg. Keep splint clean and dry Elevate toes above nose   Driving restrictions    Complete by:  As directed    No driving for 6 weeks   Increase activity slowly as tolerated    Complete by:  As directed    Lifting restrictions    Complete by:  As directed    No lifting for 6 weeks      Follow-up Information    Lavell Ridings, Arlys John, MD. Schedule an appointment as soon as possible for a visit on 03/28/2017.   Specialty:  Orthopedic Surgery Contact information: [address] [place] [address] [phone]        [facility] Follow up.   Specialty:  Specialist Why:  You have an appointment scheduled with Dr. Linna Caprice on Wednesday, Mar 28, 2017 at 8:15AM. Contact  information: [address] [place] [address] [phone]        Home Health Follow up.   Why:  You will recieve a call from the  Home Health Agency designated by [employer]. to arrange start date and time for your therapy and aide.           Signed: Garnet Koyanagi 03/26/2017, 10:37 AM

## 2017-03-24 NOTE — Evaluation (Signed)
Occupational Therapy Evaluation Patient Details Name: Ryan DykesJoshua Oconnell MRN: [id number] DOB: 07/25/1986 Today's Date: 03/24/2017    History of Present Illness Ryan Oconnell was working when a stack of sheet metal fell onto him, knocking him backwards and striking his legs. One leg was behind the other and acted like a fulcrum causing the other to break. He c/o right lower leg pain and also left foot pain. Pt with open R tib/fib fx and underwent IM nail. Displaced 2nd and 3rd MT heads, conservative mgmt. MRI revealed other fractures in L foot, therefore, L foot is now NWB and in splint.    Clinical Impression   PTA, pt was independent. Pt lives in [place] and is in [place] for work. Currently, pt requires set up A for UB ADLs and  Mod A and AE for LB ADLs. Educated pt on WB status and he demonstrated understanding. Pt performed sit<>stand with Max A and RW demonstrated limited activity tolerance due to significant pain. Pt would benefit from skilled OT to address tub transfers and ADLs. Recommend dc home with HHOT to increase his safety and independence with ADLs and functional mobility.     Follow Up Recommendations  Home health OT;Supervision/Assistance - 24 hour    Equipment Recommendations  Tub/shower bench;3 in 1 bedside commode (Drop arm 3N1)    Recommendations for Other Services       Precautions / Restrictions Precautions Precautions: None Required Braces or Orthoses: Other Brace/Splint Other Brace/Splint: Pt in L split due to ligament injury.  Do not use post-op shoe anymore.   Restrictions Weight Bearing Restrictions: Yes RLE Weight Bearing: Weight bearing as tolerated LLE Weight Bearing: Non weight bearing      Mobility Bed Mobility Overal bed mobility: Needs Assistance Bed Mobility: Supine to Sit     Supine to sit: Min assist     General bed mobility comments: Min A to support RLE during bed mobility, but demonstrates good trunk control.   Transfers Overall transfer level:  Needs assistance Equipment used: Rolling walker (2 wheeled) Transfers: Sit to/from Stand Sit to Stand: Max assist         General transfer comment: Pt performed sit<>stand with Max A and BUE to push up. Due to pain in BLE, pt's standing toelrance is limited.     Balance Overall balance assessment: Needs assistance Sitting-balance support: No upper extremity supported;Feet supported Sitting balance-Leahy Scale: Normal     Standing balance support: Bilateral upper extremity supported;During functional activity Standing balance-Leahy Scale: Poor Standing balance comment: Pt requires UE support for balance                           ADL either performed or assessed with clinical judgement   ADL Overall ADL's : Needs assistance/impaired Eating/Feeding: Set up;Sitting   Grooming: Set up;Sitting   Upper Body Bathing: Set up;Sitting   Lower Body Bathing: Set up;Sitting/lateral leans   Upper Body Dressing : Set up;Sitting   Lower Body Dressing: Sitting/lateral leans;Moderate assistance;With adaptive equipment Lower Body Dressing Details (indicate cue type and reason): Educated on LB dressing technique Toilet Transfer: Minimal assistance (Lateral scoot using BUE for support. 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 Lacking about 10 degrees extension due to pain. Able to extend 5 more degrees with quad set RLE: Unable to fully assess due to pain LLE Deficits / Details: dorsal foot very painful, could not tolerate  full standing. Ankle mildly stiff. Hip and knee WFL LLE: Unable to fully assess due to pain   Cervical / Trunk Assessment Cervical / Trunk Assessment: Normal   Communication Communication Communication: No difficulties   Cognition Arousal/Alertness: Awake/alert Behavior During Therapy: WFL for tasks assessed/performed Overall Cognitive Status: Within Functional Limits for tasks assessed                                     General Comments       Exercises General Exercises - Lower Extremity Heel Slides:  (limited ROM on R side. )   Shoulder Instructions      Home Living Family/patient expects to be discharged to:: Private residence Living Arrangements: Non-relatives/Friends Available Help at Discharge: Friend(s);Available PRN/intermittently Type of Home: House Home Access: Stairs to enter [employer] of Steps: 3 Entrance Stairs-Rails: Right Home Layout: One level     Bathroom Shower/Tub: Tub/shower unit;Walk-in shower   Bathroom Toilet: Standard     Home Equipment: Hand held shower head   Additional Comments: Staying in [place] for the remainder of the job      Prior Functioning/Environment Level of Independence: Independent        Comments: pt is from [place] (4 hrs away), 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 He thinks that his boss is going to have him stay here in [place] at least for awhile. Will not have help while crew is working.         OT Problem List: Decreased strength;Decreased range of motion;Decreased activity tolerance;Impaired balance (sitting and/or standing);Decreased safety awareness;Decreased knowledge of use of DME or AE;Decreased knowledge of precautions;Pain      OT Treatment/Interventions: Self-care/ADL training;Therapeutic exercise;Energy conservation;DME and/or AE instruction;Therapeutic activities;Patient/family education    OT Goals(Current goals can be found in the care plan  section) Acute Rehab OT Goals Patient Stated Goal: return to work OT Goal Formulation: With patient Time For Goal Achievement: 04/07/17 Potential to Achieve Goals: Good ADL Goals Pt Will Perform Lower Body Dressing: with set-up;with supervision;sitting/lateral leans;with adaptive equipment Pt Will Transfer to Toilet: with min guard assist;bedside commode;squat pivot transfer (lateral scoot) Pt Will Perform Tub/Shower Transfer: Tub transfer;tub bench;Squat pivot transfer;with min guard assist  OT Frequency: Min 2X/week   Barriers to D/C:            Co-evaluation              AM-PAC PT "6 Clicks" Daily Activity     Outcome Measure Help from another person eating meals?: None Help from another person taking care of personal grooming?: A Little Help from another person toileting, which includes using toliet, bedpan, or urinal?: A Lot Help from another person bathing (including washing, rinsing, drying)?: A Lot Help from another person to put on and taking off regular upper body clothing?: None Help from another person to put on and taking off regular lower body clothing?:  A Lot 6 Click Score: 17   End of Session Equipment Utilized During Treatment: Gait belt;Rolling walker Nurse Communication: Mobility status;Precautions;Weight bearing status  Activity Tolerance: Patient limited by pain Patient left: in chair;with call bell/phone within reach  OT Visit Diagnosis: Unsteadiness on feet (R26.81);Muscle weakness (generalized) (M62.81);Pain Pain - Right/Left: Right (R>L) Pain - part of body: Leg                Time: 1610-9604 OT Time Calculation (min): 46 min Charges:  OT General Charges $OT Visit: 1 Procedure OT Evaluation $OT Eval Moderate Complexity: 1 Procedure OT Treatments $Self Care/Home Management : 8-22 mins G-Codes:     [employer], OTR/L [phone]  Theodoro Grist Dovey Fatzinger 03/24/2017, 12:42 PM

## 2017-03-24 NOTE — Progress Notes (Signed)
   Subjective:  Patient reports pain as mild to moderate.  No c/o.  Objective:   VITALS:   Vitals:   03/22/17 2030 03/23/17 0520 03/23/17 2046 03/24/17 0543  BP: (!) 153/72 (!) 151/77 137/78 121/80  Pulse: 73 82 89 94  Resp: 16 17    Temp: 98.9 F (37.2 C) 100.1 F (37.8 C) 98.9 F (37.2 C) 98.9 F (37.2 C)  TempSrc: Oral Oral Oral Oral  SpO2: 100% 100% 100% 100%  Weight:      Height:        NAD ABD soft Sensation intact distally Intact pulses distally Dorsiflexion/Plantar flexion intact Incision: dressing C/D/I Compartment soft No pain with passive stretch  Lab Results  Component Value Date   WBC 12.5 (H) 03/20/2017   HGB 14.0 03/20/2017   HCT 41.6 03/20/2017   MCV 92.0 03/20/2017   PLT 215 03/20/2017   BMET    Component Value Date/Time   NA 139 03/20/2017 1543   K 4.0 03/20/2017 1543   CL 106 03/20/2017 1543   CO2 24 03/20/2017 1543   GLUCOSE 97 03/20/2017 1543   BUN 15 03/20/2017 1543   CREATININE 1.11 03/20/2017 1543   CALCIUM 9.6 03/20/2017 1543   GFRNONAA >60 03/20/2017 1543   GFRAA >60 03/20/2017 1543     Assessment/Plan: 4 Days Post-Op   Active Problems:   Open right tibial fracture   Open displaced transverse fracture of shaft of right tibia   A/P: 1. Grade II open right tibia fx s/p I&D, IM nail 2. L lisfranc ligament injury, MT neck 2-3 fxs  -WBAT RLE with walker -NWB LLE in splint -DVT ppx: lovenox in house --> home on ASA -PT/OT -Dispo: D/C home Sunday, f/u in the office this Wed with me    Aayra Hornbaker, Cloyde ReamsBrian James 03/24/2017, 8:03 AM   Samson FredericBrian Melinda Pottinger, MD Cell [phone]

## 2017-03-25 NOTE — Progress Notes (Signed)
Occupational Therapy Treatment Patient Details Name: Bora Broner MRN: [id number] DOB: 1986/10/26 Today's Date: 03/25/2017    History of present illness [name] was working when a stack of sheet metal fell onto him, knocking him backwards and striking his legs. One leg was behind the other and acted like a fulcrum causing the other to break. He c/o right lower leg pain and also left foot pain. Pt with open R tib/fib fx and underwent IM nail. Displaced 2nd and 3rd MT heads, conservative mgmt. MRI revealed other fractures in L foot, therefore, L foot is now NWB and in splint.    OT comments  Pt progressing towards established goals. Educated pt on safe tub transfer technique with tub bench. Pt performed tub transfer with Min A to manage RLE due to increased pain with movement. Continues to have poor tolerance of weight bearing in RLE. However, pt perform squat pivot transfer from EOB to recliner with Max A and RW. Will continue to follow acutely. Continue to recommend dc home with HHOT to increase safety and optimize independence.   Follow Up Recommendations  Home health OT;Supervision/Assistance - 24 hour    Equipment Recommendations  Tub/shower bench;3 in 1 bedside commode (Drop arm 3N1)    Recommendations for Other Services      Precautions / Restrictions Precautions Precautions: None Required Braces or Orthoses: Other Brace/Splint Other Brace/Splint: Pt in L split due to ligament injury.  Do not use post-op shoe anymore.   Restrictions Weight Bearing Restrictions: Yes RLE Weight Bearing: Weight bearing as tolerated LLE Weight Bearing: Non weight bearing       Mobility Bed Mobility Overal bed mobility: Needs Assistance Bed Mobility: Supine to Sit     Supine to sit: Min assist     General bed mobility comments: assist to lower R LE from EOB  Transfers Overall transfer level: Needs assistance Equipment used: None Transfers: Lateral/Scoot Transfers     Squat pivot  transfers: Max assist    Lateral/Scoot Transfers: Min guard General transfer comment: Pt performed stand pivot transfer with Max A since pt has poor weight bearing tolerance on RLE    Balance Overall balance assessment: Needs assistance Sitting-balance support: No upper extremity supported;Feet supported Sitting balance-Leahy Scale: Normal     Standing balance support: Bilateral upper extremity supported;During functional activity Standing balance-Leahy Scale: Poor Standing balance comment: Pt requires UE support for balance                           ADL either performed or assessed with clinical judgement   ADL Overall ADL's : Needs assistance/impaired                         Toilet Transfer:  (Lateral scoot using BUE for support. Simualted to recliner)       Tub/ Shower Transfer: Tub transfer;Minimal assistance;Tub bench Tub/Shower Transfer Details (indicate cue type and reason): Pt used BUE to power over to tub bench from recliner. Min A for RLE managment due to pain with movement Functional mobility during ADLs: Maximal assistance;Rolling walker General ADL Comments: Session focused on functional transfer in preparation for bathing after dc. Pt demonstrated understanding of education and performed transfer with Min A. Pt also performed squat-pivot transfer with Max A to power up and weight bear on his RLE     Vision       Perception     Praxis  Cognition Arousal/Alertness: Awake/alert Behavior During Therapy: Agitated;Anxious Overall Cognitive Status: Within Functional Limits for tasks assessed                                 General Comments: pt very emotional during session however agreed to participate upon arrival; pt educated on benefits of OOB mobility         Exercises     Shoulder Instructions       General Comments      Pertinent Vitals/ Pain       Pain Assessment: 0-10 Pain Score: 8  Faces Pain Scale:  Hurts whole lot Pain Location: R LE Pain Descriptors / Indicators: Aching;Grimacing;Guarding;Sore;Crying Pain Intervention(s): Monitored during session;Patient requesting pain meds-RN notified;Limited activity within patient's tolerance  Home Living                                          Prior Functioning/Environment              Frequency  Min 2X/week        Progress Toward Goals  OT Goals(current goals can now be found in the care plan section)  Progress towards OT goals: Progressing toward goals  Acute Rehab OT Goals Patient Stated Goal: return to work OT Goal Formulation: With patient Time For Goal Achievement: 04/07/17 Potential to Achieve Goals: Good ADL Goals Pt Will Perform Lower Body Dressing: with set-up;with supervision;sitting/lateral leans;with adaptive equipment Pt Will Transfer to Toilet: with min guard assist;bedside commode;squat pivot transfer (lateral scoot) Pt Will Perform Tub/Shower Transfer: Tub transfer;tub bench;Squat pivot transfer;with min guard assist  Plan Discharge plan remains appropriate    Co-evaluation                 AM-PAC PT "6 Clicks" Daily Activity     Outcome Measure   Help from another person eating meals?: None Help from another person taking care of personal grooming?: A Little Help from another person toileting, which includes using toliet, bedpan, or urinal?: A Lot Help from another person bathing (including washing, rinsing, drying)?: A Lot Help from another person to put on and taking off regular upper body clothing?: None Help from another person to put on and taking off regular lower body clothing?: A Lot 6 Click Score: 17    End of Session Equipment Utilized During Treatment: Gait belt;Rolling walker  OT Visit Diagnosis: Unsteadiness on feet (R26.81);Muscle weakness (generalized) (M62.81);Pain Pain - Right/Left: Right (R>L) Pain - part of body: Leg   Activity Tolerance Patient limited  by pain;Patient tolerated treatment well   Patient Left with call bell/phone within reach;in bed   Nurse Communication Mobility status;Precautions;Weight bearing status        Time: [id number] OT Time Calculation (min): 36 min  Charges: OT General Charges $OT Visit: 1 Procedure OT Treatments $Self Care/Home Management : 23-37 mins  Jacquline Terrill, OTR/L [phone]   Theodoro GristCharis M Alexyia Guarino 03/25/2017, 3:17 PM

## 2017-03-25 NOTE — Progress Notes (Signed)
Physical Therapy Treatment Patient Details Name: Ryan Oconnell MRN: [id number] DOB: [birth year] Today's Date: 03/25/2017    History of Present Illness Ryan Oconnell was working when a stack of sheet metal fell onto him, knocking him backwards and striking his legs. One leg was behind the other and acted like a fulcrum causing the other to break. He c/o right lower leg pain and also left foot pain. Pt with open R tib/fib fx and underwent IM nail. Displaced 2nd and 3rd MT heads, conservative mgmt. MRI revealed other fractures in L foot, therefore, L foot is now NWB and in splint.     PT Comments    Patient agreeable to participate in therapy and for OOB mobility. Patient able to perform lateral transfer EOB to recliner with min guard assist for safety and maintaining NWB L LE. Attempted sit to stand however pt unable to bear adequate weight through R LE due to pain. Pt very emotional throughout session and became easily agitated once he began mobilizing.  Pt educated on benefits of OOB mobility. RN informed pt requesting pain meds.   Follow Up Recommendations  Home health PT     Equipment Recommendations  Wheelchair (measurements PT);Rolling walker with 5" wheels    Recommendations for Other Services OT consult     Precautions / Restrictions Precautions Precautions: None Restrictions Weight Bearing Restrictions: Yes RLE Weight Bearing: Weight bearing as tolerated LLE Weight Bearing: Non weight bearing    Mobility  Bed Mobility Overal bed mobility: Needs Assistance Bed Mobility: Supine to Sit     Supine to sit: Min assist     General bed mobility comments: assist to lower R LE from EOB  Transfers Overall transfer level: Needs assistance Equipment used: None Transfers: Lateral/Scoot Transfers          Lateral/Scoot Transfers: Min guard General transfer comment: attempted sit to stand from EOB however  pt unable to bear weight on R LE or flex R knee enough for safe positioning;  cues for sequencing and technique for lateral scoot into recliner; pt with adequate strength bilat UE to perform with min guard for safety; cues to maintain L LE NWB during transfer  Ambulation/Gait             General Gait Details: unable due to pain   Stairs            Wheelchair Mobility    Modified Rankin (Stroke Patients Only)       Balance Overall balance assessment: Needs assistance Sitting-balance support: No upper extremity supported;Feet supported Sitting balance-Leahy Scale: Normal                                      Cognition Arousal/Alertness: Awake/alert Behavior During Therapy: Agitated;Anxious Overall Cognitive Status: Within Functional Limits for tasks assessed                                 General Comments: pt very emotional during session however agreed to participate upon arrival; pt educated on benefits of OOB mobility       Exercises      General Comments        Pertinent Vitals/Pain Pain Assessment: Faces Faces Pain Scale: Hurts whole lot Pain Location: R LE Pain Descriptors / Indicators: Aching;Grimacing;Guarding;Sore;Crying Pain Intervention(s): Limited activity within patient's tolerance;Monitored during session;Premedicated before session;Repositioned;Patient requesting pain meds-RN notified  Home Living                      Prior Function            PT Goals (current goals can now be found in the care plan section) Acute Rehab PT Goals PT Goal Formulation: With patient Time For Goal Achievement: 03/28/17 Potential to Achieve Goals: Good Progress towards PT goals: Progressing toward goals    Frequency    Min 5X/week      PT Plan Current plan remains appropriate    Co-evaluation              AM-PAC PT "6 Clicks" Daily Activity  Outcome Measure  Difficulty turning over in bed (including adjusting bedclothes, sheets and blankets)?: A Little Difficulty moving  from lying on back to sitting on the side of the bed? : Total Difficulty sitting down on and standing up from a chair with arms (e.g., wheelchair, bedside commode, etc,.)?: Total Help needed moving to and from a bed to chair (including a wheelchair)?: A Little Help needed walking in hospital room?: Total Help needed climbing 3-5 steps with a railing? : Total 6 Click Score: 10    End of Session Equipment Utilized During Treatment: Gait belt Activity Tolerance: Patient limited by pain Patient left: in chair;with call bell/phone within reach Nurse Communication: Mobility status;Patient requests pain meds PT Visit Diagnosis: Pain;Other abnormalities of gait and mobility (R26.89) Pain - Right/Left:  (bilateral) Pain - part of body: Leg;Ankle and joints of foot     Time: [id number] PT Time Calculation (min) (ACUTE ONLY): 26 min  Charges:  $Therapeutic Activity: 23-37 mins                    G Codes:       Erline LevineKellyn Enijah Furr, PTA Pager: [phone]     Carolynne EdouardKellyn R Annabelle Rexroad 03/25/2017, 11:38 AM

## 2017-03-25 NOTE — Progress Notes (Signed)
   Subjective:  Patient reports pain as mild to moderate.  No c/o.  Objective:   VITALS:   Vitals:   03/24/17 0543 03/24/17 1500 03/24/17 2109 03/25/17 0432  BP: 121/80 (!) 145/79 (!) 147/74 (!) 163/81  Pulse: 94 91 86 98  Resp:  17 18 18   Temp: 98.9 F (37.2 C) 98.9 F (37.2 C) 98.1 F (36.7 C) 98.6 F (37 C)  TempSrc: Oral Oral Oral Oral  SpO2: 100% 100% 100% 100%  Weight:      Height:        NAD ABD soft Sensation intact distally Intact pulses distally Dorsiflexion/Plantar flexion intact Incision: dressing C/D/I Compartment soft No pain with passive stretch  Lab Results  Component Value Date   WBC 12.5 (H) 03/20/2017   HGB 14.0 03/20/2017   HCT 41.6 03/20/2017   MCV 92.0 03/20/2017   PLT 215 03/20/2017   BMET    Component Value Date/Time   NA 139 03/20/2017 1543   K 4.0 03/20/2017 1543   CL 106 03/20/2017 1543   CO2 24 03/20/2017 1543   GLUCOSE 97 03/20/2017 1543   BUN 15 03/20/2017 1543   CREATININE 1.11 03/20/2017 1543   CALCIUM 9.6 03/20/2017 1543   GFRNONAA >60 03/20/2017 1543   GFRAA >60 03/20/2017 1543     Assessment/Plan: 5 Days Post-Op   Principal Problem:   Open right tibial fracture Active Problems:   Open displaced transverse fracture of shaft of right tibia   Lisfranc's sprain, left, initial encounter   Closed fracture of metatarsal neck, left, initial encounter   A/P: 1. Grade II open right tibia fx s/p I&D, IM nail 2. L lisfranc ligament injury, MT neck 2-3 fxs  -WBAT RLE with walker -NWB LLE in splint -DVT ppx: lovenox in house --> home on ASA -PT/OT -Dispo: plan for D/C home today if d/c needs arranged, will have social work confirm delivery of DME, f/u in the office this Wed with me    Ryan Oconnell, Ryan Oconnell 03/25/2017, 7:39 AM   Ryan FredericBrian Zhi Geier, MD Cell [phone]

## 2017-03-26 MED ORDER — OXYCODONE HCL 5 MG PO TABS: 5.0000 mg | ORAL_TABLET | ORAL | Status: DC | PRN

## 2017-03-26 NOTE — Progress Notes (Signed)
Occupational Therapy Treatment Patient Details Name: Ryan Oconnell MRN: [id number] DOB: [birth year] Today's Date: 03/26/2017    History of present illness Ryan Oconnell was working when a stack of sheet metal fell onto him, knocking him backwards and striking his legs. One leg was behind the other and acted like a fulcrum causing the other to break. He c/o right lower leg pain and also left foot pain. Pt with open R tib/fib fx and underwent IM nail. Displaced 2nd and 3rd MT heads, conservative mgmt. MRI revealed other fractures in L foot, therefore, L foot is now NWB and in splint.    OT comments  Pt demonstrating progress towards established goals and shows increased tolerance of movement. Pt performed LB ADLs at bed level with supervision; pt demonstrated good and safe technique. Pt required Mod A for stand pivot transfer from EOB to recliner but continues to report significant pain with WB through RLE. Answered pt's questions in preparation for dc today. Continue to recommend dc with HHOT to increase safety and independence with ADLs and functional mobility.    Follow Up Recommendations  Home health OT;Supervision/Assistance - 24 hour    Equipment Recommendations  Tub/shower bench;3 in 1 bedside commode (Drop arm 3N1)    Recommendations for Other Services      Precautions / Restrictions Precautions Precautions: None Required Braces or Orthoses: Other Brace/Splint Other Brace/Splint: Pt in L split due to ligament injury.  Do not use post-op shoe anymore.   Restrictions Weight Bearing Restrictions: Yes RLE Weight Bearing: Weight bearing as tolerated LLE Weight Bearing: Non weight bearing       Mobility Bed Mobility Overal bed mobility: Needs Assistance Bed Mobility: Supine to Sit     Supine to sit: Supervision     General bed mobility comments: Pt demonstrating increased tolerance of movement for RLE  Transfers Overall transfer level: Needs assistance Equipment used:  None Transfers: Stand Pivot Transfers;Sit to/from Stand Sit to Stand: Mod assist Stand pivot transfers: Mod assist       General transfer comment: Pt performed stand pivot transfer with Mod A since pt has poor weight bearing tolerance on RLE    Balance Overall balance assessment: Needs assistance Sitting-balance support: No upper extremity supported;Feet supported Sitting balance-Ryan Oconnell Scale: Normal     Standing balance support: Bilateral upper extremity supported;During functional activity Standing balance-Ryan Oconnell Scale: Poor Standing balance comment: Pt requires UE support for balance                           ADL either performed or assessed with clinical judgement   ADL Overall ADL's : Needs assistance/impaired                     Lower Body Dressing: Supervision/safety;Bed level Lower Body Dressing Details (indicate cue type and reason): Pt demonstrating increased ROM and movement tolerance. Able to don underwear with supervision at bed level Toilet Transfer:  (Lateral scoot using BUE for support. Simualted to recliner)           Functional mobility during ADLs: Rolling walker;Moderate assistance General ADL Comments: Pt performed LB dressing and used good technique for safety. Pt performed stand pivot transfer from EOB to recliner with Mod A. Answered pt's questions in preparation for dc today.      Vision       Perception     Praxis      Cognition Arousal/Alertness: Awake/alert Behavior During Therapy: WFL for tasks assessed/performed Overall  Cognitive Status: Within Functional Limits for tasks assessed                                 General Comments: Pt slightly tearful at end of session due to pain in RLE with movement and WB        Exercises     Shoulder Instructions       General Comments      Pertinent Vitals/ Pain       Pain Assessment: Faces Faces Pain Scale: Hurts whole lot Pain Location: R LE Pain  Descriptors / Indicators: Aching;Grimacing;Guarding;Sore;Crying  Home Living                                          Prior Functioning/Environment              Frequency  Min 2X/week        Progress Toward Goals  OT Goals(current goals can now be found in the care plan section)  Progress towards OT goals: Progressing toward goals  Acute Rehab OT Goals Patient Stated Goal: return to work OT Goal Formulation: With patient Time For Goal Achievement: 04/07/17 Potential to Achieve Goals: Good ADL Goals Pt Will Perform Lower Body Dressing: with set-up;with supervision;sitting/lateral leans;with adaptive equipment Pt Will Transfer to Toilet: with min guard assist;bedside commode;squat pivot transfer (lateral scoot) Pt Will Perform Tub/Shower Transfer: Tub transfer;tub bench;Squat pivot transfer;with min guard assist  Plan Discharge plan remains appropriate    Co-evaluation                 AM-PAC PT "6 Clicks" Daily Activity     Outcome Measure   Help from another person eating meals?: None Help from another person taking care of personal grooming?: A Little Help from another person toileting, which includes using toliet, bedpan, or urinal?: A Lot Help from another person bathing (including washing, rinsing, drying)?: A Lot Help from another person to put on and taking off regular upper body clothing?: None Help from another person to put on and taking off regular lower body clothing?: None 6 Click Score: 19    End of Session Equipment Utilized During Treatment: Gait belt;Rolling walker  OT Visit Diagnosis: Unsteadiness on feet (R26.81);Muscle weakness (generalized) (M62.81);Pain Pain - Right/Left: Right (R>L) Pain - part of body: Leg   Activity Tolerance Patient limited by pain;Patient tolerated treatment well   Patient Left with call bell/phone within reach;in chair;with nursing/sitter in room   Nurse Communication Mobility  status;Precautions;Weight bearing status        Time: [id number] OT Time Calculation (min): 12 min  Charges: OT General Charges $OT Visit: 1 Procedure OT Treatments $Self Care/Home Management : 8-22 mins  [name], OTR/L [phone]   [name] 03/26/2017, 12:54 PM

## 2017-03-26 NOTE — Progress Notes (Addendum)
LM with workers comp notifying DC planned for today and patient needs DME. Also Sp[facility], she too is contact w workers comp and is in the process of getting approval for DME and HH. Clydie BraunKaren stated she will contact me when everything approved and patient can go. Anticipate after lunch.  Spoke with Dois DavenportSandra from workers comp. She states that [facility] has Rx info to bill workers comp for meds and Insurance account managerpre authorizations. Patient instructed to take Rx there at DC to get filled. Orders faxed to Dois DavenportSandra for DME and [facility] [phone] at 11:00.

## 2017-03-26 NOTE — Progress Notes (Signed)
   Subjective:  Patient reports pain as mild to moderate.  No c/o.  Objective:   VITALS:   Vitals:   03/24/17 2109 03/25/17 0432 03/25/17 1807 03/26/17 0644  BP: (!) 147/74 (!) 163/81 (!) 148/84 139/80  Pulse: 86 98 (!) 108 (!) 110  Resp: 18 18 18 16   Temp: 98.1 F (36.7 C) 98.6 F (37 C) (!) 100.4 F (38 C) 99.3 F (37.4 C)  TempSrc: Oral Oral Oral Oral  SpO2: 100% 100% 100% 100%  Weight:      Height:        NAD, resting comfortably ABD soft Sensation intact distally Intact pulses distally Dorsiflexion/Plantar flexion intact Incision: dressing C/D/I Compartment soft No pain with passive stretch  Lab Results  Component Value Date   WBC 12.5 (H) 03/20/2017   HGB 14.0 03/20/2017   HCT 41.6 03/20/2017   MCV 92.0 03/20/2017   PLT 215 03/20/2017   BMET    Component Value Date/Time   NA 139 03/20/2017 1543   K 4.0 03/20/2017 1543   CL 106 03/20/2017 1543   CO2 24 03/20/2017 1543   GLUCOSE 97 03/20/2017 1543   BUN 15 03/20/2017 1543   CREATININE 1.11 03/20/2017 1543   CALCIUM 9.6 03/20/2017 1543   GFRNONAA >60 03/20/2017 1543   GFRAA >60 03/20/2017 1543     Assessment/Plan: 6 Days Post-Op   Principal Problem:   Open right tibial fracture Active Problems:   Open displaced transverse fracture of shaft of right tibia   Lisfranc's sprain, left, initial encounter   Closed fracture of metatarsal neck, left, initial encounter   A/P: 1. Grade II open right tibia fx s/p I&D, IM nail 2. L lisfranc ligament injury, MT neck 2-3 fxs  -WBAT RLE with walker -NWB LLE in splint -DVT ppx: lovenox in house --> home on ASA -PT/OT -Dispo: plan for D/C home today if d/c needs arranged, will have social work confirm delivery of DME, f/u in the office this Wed with me    Dalena Plantz, Cloyde ReamsBrian James 03/26/2017, 10:36 AM   Samson FredericBrian Derrika Ruffalo, MD Cell [phone]

## 2017-09-09 IMAGING — MR MR FOOT*L* W/O CM
4 of 5 series · 19 of 40 positions shown · non-contrast
Comparison: None.

CLINICAL DATA: Distraction accident.  Left foot pain.

EXAM:
MRI OF THE LEFT FOOT WITHOUT CONTRAST
TECHNIQUE: Multiplanar, multisequence MR imaging of the left forefoot was
performed. No intravenous contrast was administered.

[Series 5: T1 · oblique · 4.0mm · 0.27mm/px · 3 of 37 slices shown]
[im 5/37]
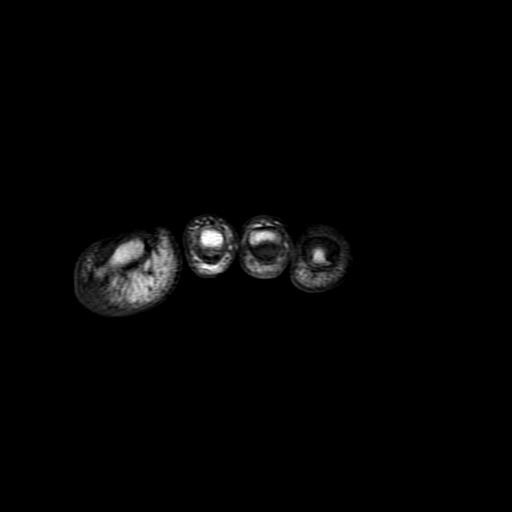
[im 19/37]
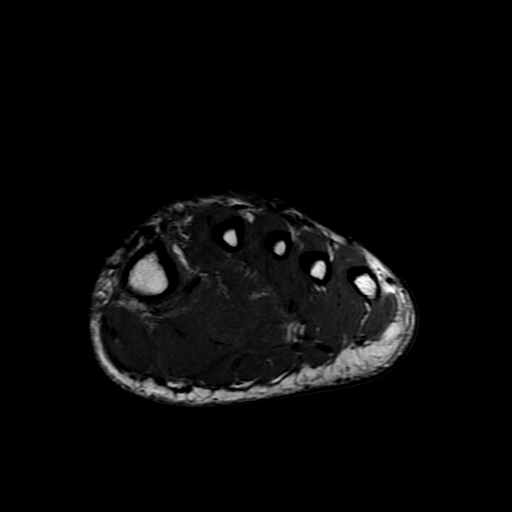
[im 32/37]
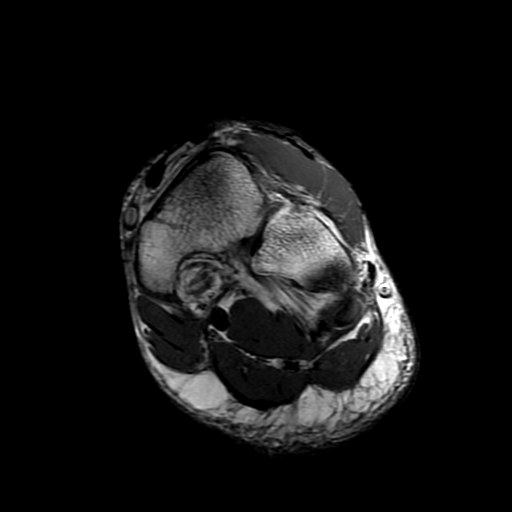

[Series 6: T2 fat-sat · oblique · 4.0mm · 0.27mm/px · 8 of 37 slices shown (1 of 3)]
[im 1/37]
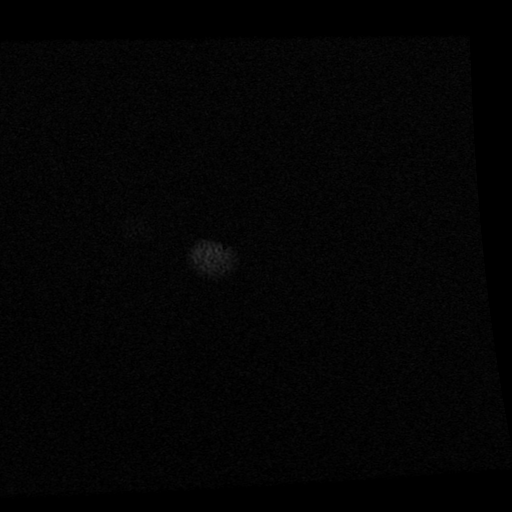
[im 5/37]
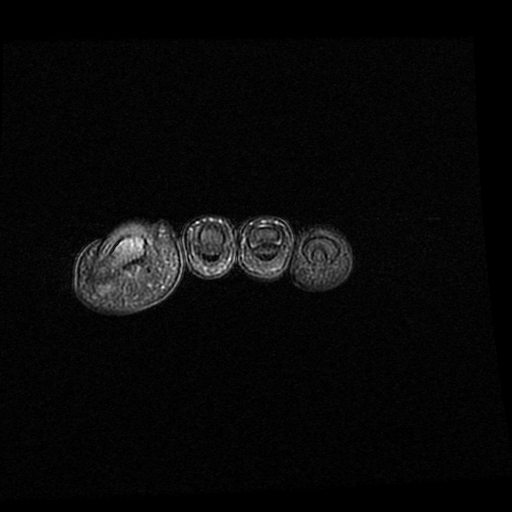
[im 13/37]
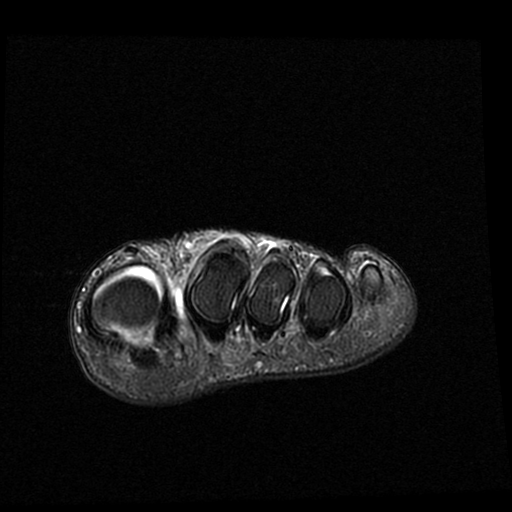
[im 17/37]
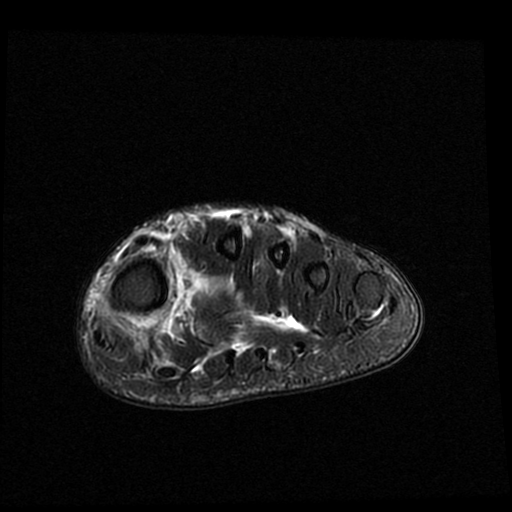
[im 21/37]
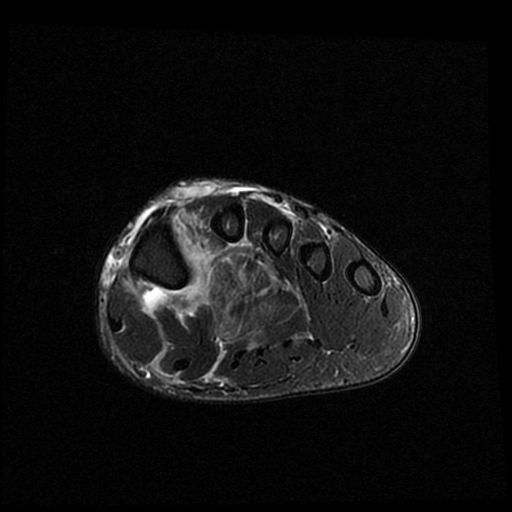
[im 25/37]
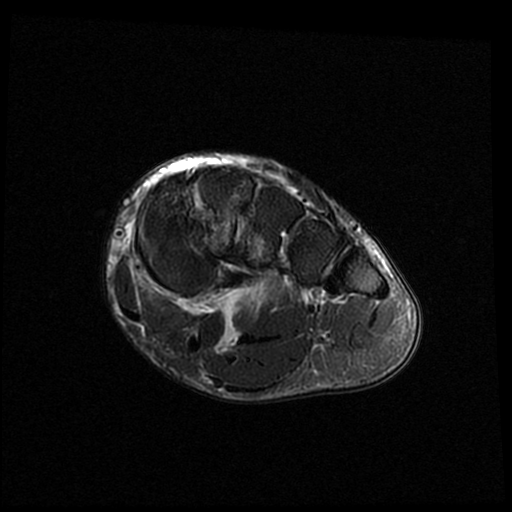
[im 33/37]
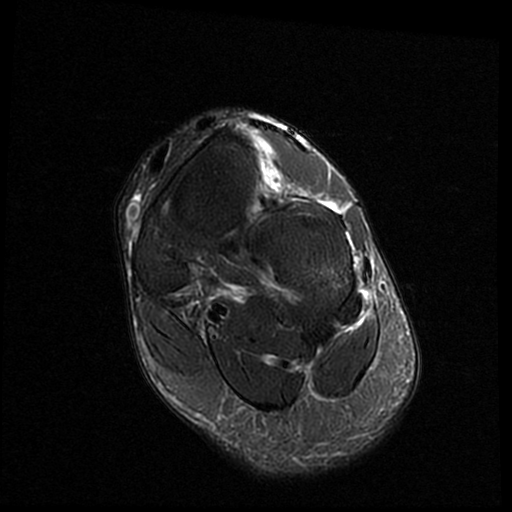
[im 37/37]
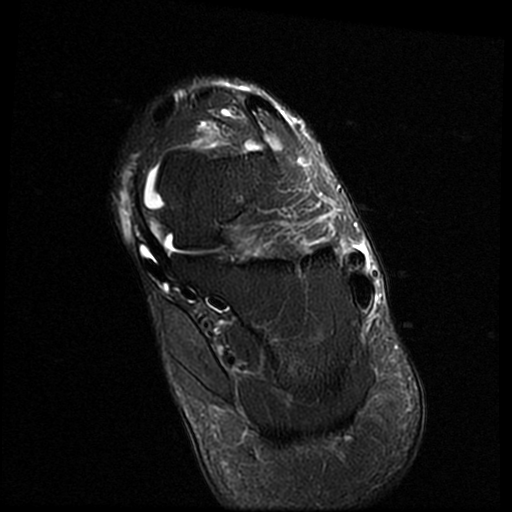

[Series 7: T2 fat-sat · oblique · 3.0mm · 0.49mm/px · 5 of 26 slices shown (2 of 3)]
[im 1/26]
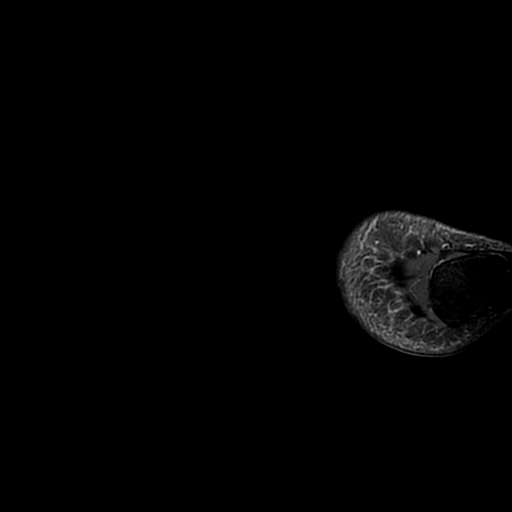
[im 5/26]
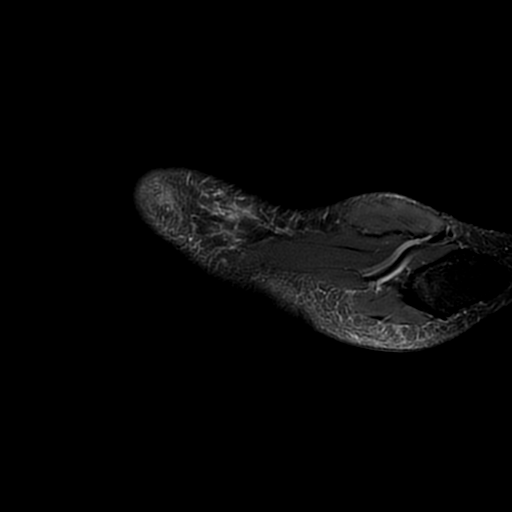
[im 9/26]
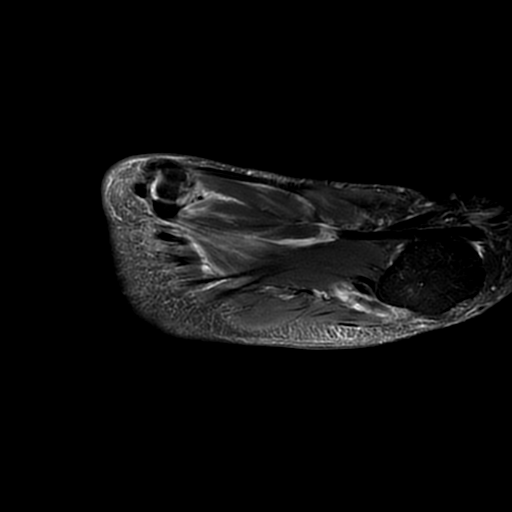
[im 13/26]
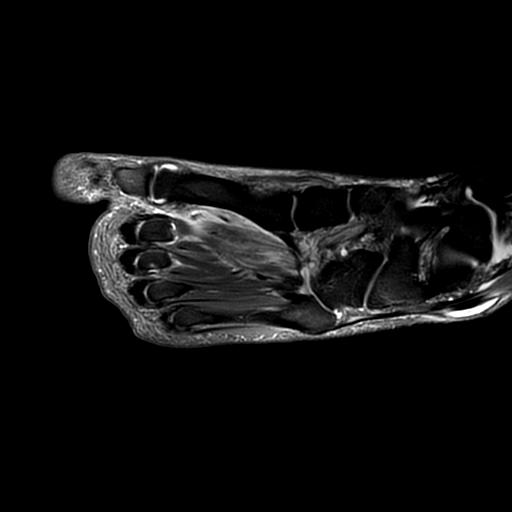
[im 21/26]
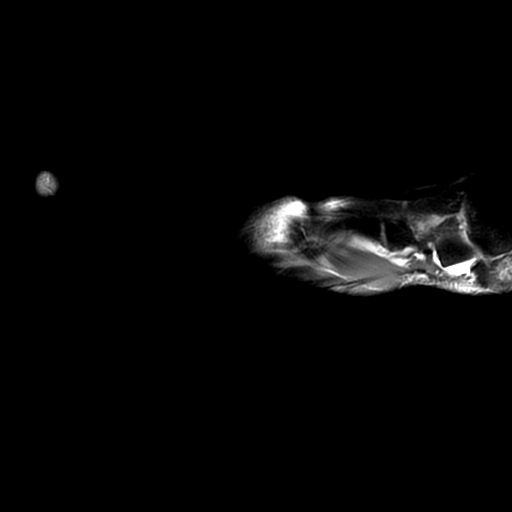

[Series 9: T2 fat-sat · sagittal · 3.0mm · 0.43mm/px · 3 of 29 slices shown (3 of 3)]
[im 5/29]
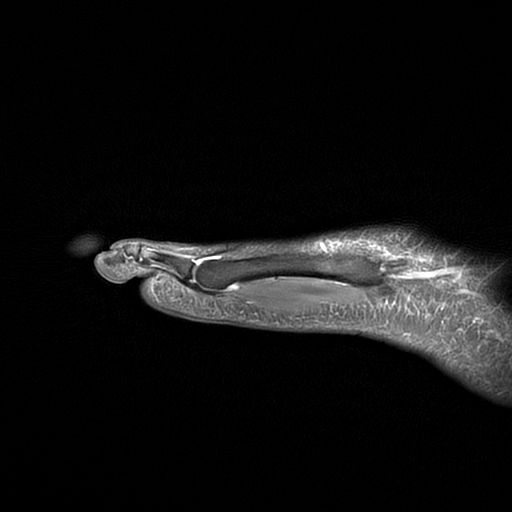
[im 15/29]
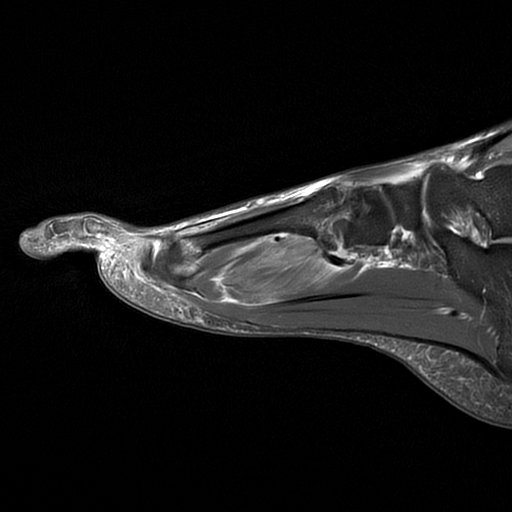
[im 24/29]
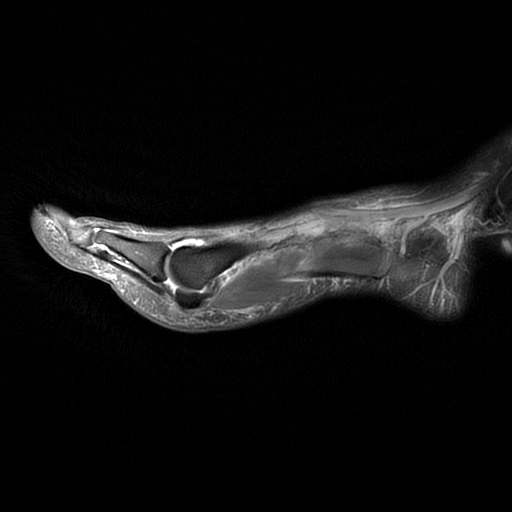

[19 of 40 positions shown; findings below may reference images not displayed]

FINDINGS: Bones/Joint/Cartilage

Bone marrow edema in the second and third metatarsal necks with a
subtle linear component consistent with nondisplaced fractures.

Nondisplaced fracture along the plantar aspect of the second
metatarsal base with surrounding marrow edema.

Mild marrow edema with possible nondisplaced fracture of the dorsal
aspect of the lateral cuneiform. Possible nondisplaced fracture of
the medial base of the third metatarsal. Mild marrow edema in the
proximal shaft of the fifth metatarsal.

Normal alignment. No joint effusion.

Ligaments

Collateral ligaments are intact. Complete tear of the Lisfranc
ligament. Anterior and posterior talofibular ligaments are intact.
Deltoid ligament is intact.

Muscles and Tendons
Flexor, peroneal and extensor compartment tendons are intact.
Muscles are normal.

Soft tissue
No fluid collection or hematoma.  No soft tissue mass.
IMPRESSION: 1.  Complete tear of the Lisfranc ligament.
2. Nondisplaced fracture along the plantar aspect of the second
metatarsal base with surrounding marrow edema.
3. Mild marrow edema with possible nondisplaced fracture of the
dorsal aspect of the lateral cuneiform. Possible nondisplaced
fracture of the medial base of the third metatarsal.
4. Nondisplaced fractures of the second and third metatarsal necks.
